# Patient Record
Sex: Female | Born: 1995 | Race: White | Hispanic: No | Marital: Married | Smoking: Former smoker
Health system: Southern US, Community
[De-identification: ages and names within clinical notes are randomized; demographics above are authoritative.]

## PROBLEM LIST (undated history)

## (undated) HISTORY — PX: OTHER SURGICAL HISTORY: SHX169

---
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
## 2016-04-04 ENCOUNTER — Emergency Department (HOSPITAL_COMMUNITY)
Admission: EM | Admit: 2016-04-04 | Discharge: 2016-04-04 | Disposition: A | Discharge status: Home / Self Care | Payer: Medicaid Other | Attending: Emergency Medicine | Admitting: Emergency Medicine

## 2016-04-04 ENCOUNTER — Encounter (HOSPITAL_COMMUNITY): Payer: Self-pay | Admitting: Emergency Medicine

## 2016-04-04 DIAGNOSIS — F17210 Nicotine dependence, cigarettes, uncomplicated: Secondary | ICD-10-CM | POA: Diagnosis not present

## 2016-04-04 DIAGNOSIS — Z79899 Other long term (current) drug therapy: Secondary | ICD-10-CM | POA: Insufficient documentation

## 2016-04-04 DIAGNOSIS — M436 Torticollis: Secondary | ICD-10-CM | POA: Insufficient documentation

## 2016-04-04 DIAGNOSIS — M542 Cervicalgia: Secondary | ICD-10-CM | POA: Diagnosis present

## 2016-04-04 DIAGNOSIS — Z791 Long term (current) use of non-steroidal anti-inflammatories (NSAID): Secondary | ICD-10-CM | POA: Diagnosis not present

## 2016-04-04 MED ORDER — IBUPROFEN 600 MG PO TABS: 600.0000 mg | ORAL_TABLET | Freq: Four times a day (QID) | ORAL | 0 refills | Status: DC | PRN

## 2016-04-04 MED ORDER — CYCLOBENZAPRINE HCL 5 MG PO TABS: 5.0000 mg | ORAL_TABLET | Freq: Three times a day (TID) | ORAL | 0 refills | Status: DC | PRN

## 2016-04-04 NOTE — ED Triage Notes (Signed)
Pt states she woke with left sided neck pain that radiates to shoulder. Pt denies any injury.

## 2016-04-04 NOTE — ED Provider Notes (Signed)
[facility]PT Provider Note   CSN: [id number] Arrival date & time: 04/04/16  1902  First Provider Contact:  None    By signing my name below, I, Majel HomerPeyton Lee, attest that this documentation has been prepared under the direction and in the presence of non-physician practitioner, Burgess AmorJulie Geanna Divirgilio, PA-C. Electronically Signed: Majel HomerPeyton Lee, Scribe. 04/04/2016. 7:37 PM.  History   Chief Complaint Chief Complaint  Patient presents with  . Neck Pain   The history is provided by the patient. No language interpreter was used.   HPI Comments: Kathleen Lucas is a 20 y.o. female who presents to the Emergency Department complaining of gradually worsening, "sharp," 6/10, central neck pain and neck stiffness that began 2 days ago and worsened today. Pt reports she noticed her pain when she awoke from a nap and expereinced a "pinching" sensation in her neck when turning her head to the side. She states she can move her neck more easily when turning her head to the right compared to her left. She notes her neck pain radiates between her shoulder blades. She states her pain is exacerbated with movement, lying down flat on her back, sitting straight up and moving her upper extremities upwards. She states she has taken ibuprofen, tylenol and aleve with no relief. She denies any injury or illness, numbness and weakness in her extremities, fever, coughing and shortness of breath. Pt reports she currently works in [employer] and is remodeling a house; she notes she was sent home from work today due to pain. She states no known drug allergies.   History reviewed. No pertinent past medical history.  Patient Active Problem List   Diagnosis Date Noted  . Dysmenorrhea 08/05/2014  . Intermittent constipation 08/05/2014   Past Surgical History:  Procedure Laterality Date  . toe nail     ingrown toenails   OB History    Gravida Para Term Preterm AB Living   0 0 0 0 0 0   SAB TAB Ectopic Multiple Live Births   0 0  0 0       Home Medications    Prior to Admission medications   Medication Sig Start Date End Date Taking? Authorizing Provider  clotrimazole (GYNE-LOTRIMIN) 1 % vaginal cream Place 1 Applicatorful vaginally 2 (two) times daily.    Historical Provider, MD  cyclobenzaprine (FLEXERIL) 5 MG tablet Take 1 tablet (5 mg total) by mouth 3 (three) times daily as needed for muscle spasms. 04/04/16   Burgess AmorJulie Palmer Shorey, PA-C  ibuprofen (ADVIL,MOTRIN) 600 MG tablet Take 1 tablet (600 mg total) by mouth every 6 (six) hours as needed. 04/04/16   Burgess AmorJulie Courtnie Brenes, PA-C  naproxen (NAPROSYN) 500 MG tablet Take 1 tablet (500 mg total) by mouth 2 (two) times daily. 12/30/13   Rolland PorterMark James, MD  norgestimate-ethinyl estradiol (ORTHO-CYCLEN,SPRINTEC,PREVIFEM) 0.25-35 MG-MCG tablet Take 1 tablet by mouth daily. 08/05/14   Jacklyn ShellFrances Cresenzo-Dishmon, CNM   Family History Family History  Problem Relation Age of Onset  . Xeroderma pigmentosa Mother   . Hyperthyroidism Mother   . Hypertension Father   . Heart disease Maternal Grandmother   . Diabetes Maternal Grandmother   . Cancer Maternal Grandmother     cervical, ovarian, colon   Social History Social History  Substance Use Topics  . Smoking status: Current Every Day Smoker    Packs/day: 0.50    Types: Cigarettes  . Smokeless tobacco: Never Used  . Alcohol use No   Allergies   Review of patient's allergies indicates no known  allergies.  Review of Systems Review of Systems  Constitutional: Negative for chills and fever.  Respiratory: Negative for cough and shortness of breath.   Musculoskeletal: Positive for neck pain and neck stiffness.  Neurological: Negative for dizziness, weakness, numbness and headaches.   Physical Exam Updated Vital Signs BP 119/70   Pulse 97   Temp 98.2 F (36.8 C)   Resp 20   Ht  (1.575 m)   Wt 135 lb (61.2 kg)   LMP 03/21/2016   SpO2 100%   BMI 24.69 kg/m   Physical Exam  Constitutional: She is oriented to person, place, and  time. She appears well-developed and well-nourished.  HENT:  Head: Normocephalic and atraumatic.  Eyes: Conjunctivae are normal. Pupils are equal, round, and reactive to light. Right eye exhibits no discharge. Left eye exhibits no discharge. No scleral icterus.  Neck: No JVD present. No tracheal deviation present.  TTP along left lateral neck to her left mid scapula; appreciable muscle spasm; no midline TTP of her C spine or thoracic spine; decreased range of motion most pronounced with leftward head rotation.  Pulmonary/Chest: Effort normal. No stridor.  Neurological: She is alert and oriented to person, place, and time. Coordination normal.  Equal grip strength; 2+ radial DTRs; normal sensation in BUE.   Psychiatric: She has a normal mood and affect. Her behavior is normal. Judgment and thought content normal.  Nursing note and vitals reviewed.   ED Treatments / Results  Labs (all labs ordered are listed, but only abnormal results are displayed) Labs Reviewed - No data to display  EKG  EKG Interpretation None      Radiology No results found.  Procedures Procedures  DIAGNOSTIC STUDIES:  Oxygen Saturation is 100% on RA, normal by my interpretation.    COORDINATION OF CARE:  7:27 PM Discussed treatment plan with pt at bedside and pt agreed to plan.  Medications Ordered in ED Medications - No data to display  Initial Impression / Assessment and Plan / ED Course  I have reviewed the triage vital signs and the nursing notes.  Pertinent labs & imaging results that were available during my care of the patient were reviewed by me and considered in my medical decision making (see chart for details).  Clinical Course    Pt with acute torticollis. She was prescribed flexeril, discussed heat tx followed by ROM exercises which were demonstrated.  Advised f/u with pcp for a recheck in one week if not improving.     I personally performed the services described in this  documentation, which was scribed in my presence. The recorded information has been reviewed and is accurate.   Final Clinical Impressions(s) / ED Diagnoses   Final diagnoses:  Torticollis, acute    New Prescriptions Discharge Medication List as of 04/04/2016  7:37 PM    START taking these medications   Details  cyclobenzaprine (FLEXERIL) 5 MG tablet Take 1 tablet (5 mg total) by mouth 3 (three) times daily as needed for muscle spasms., Starting Mon 04/04/2016, Print         Burgess Amor, PA-C 04/05/16 1825    Zadie Rhine, MD 04/06/16 [phone]

## 2016-05-12 ENCOUNTER — Emergency Department (HOSPITAL_COMMUNITY): Payer: Medicaid Other

## 2016-05-12 ENCOUNTER — Encounter (HOSPITAL_COMMUNITY): Payer: Self-pay

## 2016-05-12 ENCOUNTER — Emergency Department (HOSPITAL_COMMUNITY)
Admission: EM | Admit: 2016-05-12 | Discharge: 2016-05-12 | Disposition: A | Discharge status: Home / Self Care | Payer: Medicaid Other | Attending: Emergency Medicine | Admitting: Emergency Medicine

## 2016-05-12 DIAGNOSIS — R05 Cough: Secondary | ICD-10-CM | POA: Diagnosis present

## 2016-05-12 DIAGNOSIS — J209 Acute bronchitis, unspecified: Secondary | ICD-10-CM | POA: Insufficient documentation

## 2016-05-12 DIAGNOSIS — F17210 Nicotine dependence, cigarettes, uncomplicated: Secondary | ICD-10-CM | POA: Insufficient documentation

## 2016-05-12 DIAGNOSIS — Z79899 Other long term (current) drug therapy: Secondary | ICD-10-CM | POA: Insufficient documentation

## 2016-05-12 LAB — POC URINE PREG, ED: Preg Test, Ur: NEGATIVE

## 2016-05-12 MED ORDER — ALBUTEROL SULFATE HFA 108 (90 BASE) MCG/ACT IN AERS: 1.0000 | INHALATION_SPRAY | Freq: Four times a day (QID) | RESPIRATORY_TRACT | 0 refills | Status: AC | PRN

## 2016-05-12 MED ORDER — AZITHROMYCIN 250 MG PO TABS: 250.0000 mg | ORAL_TABLET | Freq: Every day | ORAL | 0 refills | Status: DC

## 2016-05-12 NOTE — ED Provider Notes (Signed)
[facility]-EMERGENCY DEPT Provider Note   CSN: [id number] Arrival date & time: 05/12/16  2111     History   Chief Complaint Chief Complaint  Patient presents with  . Cough    HPI Kathleen Lucas is a 20 y.o. female.  The history is provided by the patient. No language interpreter was used.  Cough  This is a new problem. The problem occurs constantly. The problem has been gradually worsening. The cough is productive of sputum. There has been no fever. Pertinent negatives include no wheezing. She has tried nothing for the symptoms. The treatment provided no relief. She is a smoker. Her past medical history does not include bronchitis or pneumonia.   Pt reports Mother recently diagnosed with bronchitis.  Pt became sick after seeing Mother, History reviewed. No pertinent past medical history.  Patient Active Problem List   Diagnosis Date Noted  . Dysmenorrhea 08/05/2014  . Intermittent constipation 08/05/2014    Past Surgical History:  Procedure Laterality Date  . toe nail     ingrown toenails    OB History    Gravida Para Term Preterm AB Living   0 0 0 0 0 0   SAB TAB Ectopic Multiple Live Births   0 0 0 0         Home Medications    Prior to Admission medications   Medication Sig Start Date End Date Taking? Authorizing Provider  albuterol (PROVENTIL HFA;VENTOLIN HFA) 108 (90 Base) MCG/ACT inhaler Inhale 1-2 puffs into the lungs every 6 (six) hours as needed for wheezing or shortness of breath. 05/12/16   Elson Areas, PA-C  azithromycin (ZITHROMAX) 250 MG tablet Take 1 tablet (250 mg total) by mouth daily. Take first 2 tablets together, then 1 every day until finished. 05/12/16   Elson Areas, PA-C  clotrimazole (GYNE-LOTRIMIN) 1 % vaginal cream Place 1 Applicatorful vaginally 2 (two) times daily.    Historical Provider, MD  cyclobenzaprine (FLEXERIL) 5 MG tablet Take 1 tablet (5 mg total) by mouth 3 (three) times daily as needed for muscle spasms. 04/04/16   Burgess Amor,  PA-C  ibuprofen (ADVIL,MOTRIN) 600 MG tablet Take 1 tablet (600 mg total) by mouth every 6 (six) hours as needed. 04/04/16   Burgess Amor, PA-C  naproxen (NAPROSYN) 500 MG tablet Take 1 tablet (500 mg total) by mouth 2 (two) times daily. 12/30/13   Rolland Porter, MD  norgestimate-ethinyl estradiol (ORTHO-CYCLEN,SPRINTEC,PREVIFEM) 0.25-35 MG-MCG tablet Take 1 tablet by mouth daily. 08/05/14   Jacklyn Shell, CNM    Family History Family History  Problem Relation Age of Onset  . Xeroderma pigmentosa Mother   . Hyperthyroidism Mother   . Hypertension Father   . Heart disease Maternal Grandmother   . Diabetes Maternal Grandmother   . Cancer Maternal Grandmother     cervical, ovarian, colon    Social History Social History  Substance Use Topics  . Smoking status: Current Every Day Smoker    Packs/day: 0.50    Types: Cigarettes  . Smokeless tobacco: Never Used  . Alcohol use No     Allergies   Review of patient's allergies indicates no known allergies.   Review of Systems Review of Systems  Respiratory: Positive for cough. Negative for wheezing.   All other systems reviewed and are negative.    Physical Exam Updated Vital Signs BP 126/68 (BP Location: Left Arm)   Pulse 111   Temp 99.2 F (37.3 C) (Oral)   Resp 20   Ht 5'  2" (1.575 m)   Wt 65.8 kg   LMP 04/24/2016 (Approximate) Comment: Negative U-preg in ED 05/12/2016  SpO2 100%   BMI 26.52 kg/m   Physical Exam  Constitutional: She appears well-developed and well-nourished. No distress.  HENT:  Head: Normocephalic and atraumatic.  Eyes: Conjunctivae are normal.  Neck: Neck supple.  Cardiovascular: Normal rate and regular rhythm.   No murmur heard. Pulmonary/Chest: Effort normal and breath sounds normal. No respiratory distress. She exhibits no tenderness.  Abdominal: Soft. There is no tenderness.  Musculoskeletal: She exhibits no edema.  Neurological: She is alert.  Skin: Skin is warm and dry.  Psychiatric:  She has a normal mood and affect.  Nursing note and vitals reviewed.    ED Treatments / Results  Labs (all labs ordered are listed, but only abnormal results are displayed) Labs Reviewed  POC URINE PREG, ED    EKG  EKG Interpretation None       Radiology Dg Chest 2 View  Result Date: 05/12/2016 CLINICAL DATA:  Cough and chest pain for 3 days. EXAM: CHEST  2 VIEW COMPARISON:  08/02/2013 FINDINGS: The cardiomediastinal contours are normal. There is central bronchial thickening. Pulmonary vasculature is normal. No consolidation, pleural effusion, or pneumothorax. No acute osseous abnormalities are seen. IMPRESSION: Central bronchial thickening, may be bronchitis, acute or chronic versus asthma. Electronically Signed   By: Rubye OaksMelanie  Ehinger M.D.   On: 05/12/2016 23:07    Procedures Procedures (including critical care time)  Medications Ordered in ED Medications - No data to display   Initial Impression / Assessment and Plan / ED Course  I have reviewed the triage vital signs and the nursing notes.  Pertinent labs & imaging results that were available during my care of the patient were reviewed by me and considered in my medical decision making (see chart for details).  Clinical Course    Pt counseled to stop smoking.   Follow up with primary MD for recheck in 1 week  Final Clinical Impressions(s) / ED Diagnoses   Final diagnoses:  Acute bronchitis, unspecified organism    New Prescriptions Discharge Medication List as of 05/12/2016 11:12 PM    START taking these medications   Details  albuterol (PROVENTIL HFA;VENTOLIN HFA) 108 (90 Base) MCG/ACT inhaler Inhale 1-2 puffs into the lungs every 6 (six) hours as needed for wheezing or shortness of breath., Starting Thu 05/12/2016, Print    azithromycin (ZITHROMAX) 250 MG tablet Take 1 tablet (250 mg total) by mouth daily. Take first 2 tablets together, then 1 every day until finished., Starting Thu 05/12/2016, Print        An After Visit Summary was printed and given to the patient.   Elson AreasLeslie K Sofia, PA-C 05/12/16 2350    Lonia SkinnerLeslie K WestportSofia, PA-C 05/12/16 Arletha [id number]    Raeford RazorStephen Kohut, MD 05/15/16 [phone]

## 2016-05-12 NOTE — ED Triage Notes (Signed)
Left ear hurts, having a runny nose, and throat hurts some.

## 2016-05-12 NOTE — ED Triage Notes (Signed)
Patient states that she has been coughing and having cold symptoms for several days.  Today is the third day.  When I start coughing it feels like my heart races and I get dizzy sometimes.

## 2016-10-15 IMAGING — DX DG CHEST 2V
2 series · 2 of 2 positions shown · non-contrast
Comparison: 08/02/2013

CLINICAL DATA: Cough and chest pain for 3 days.

EXAM:
CHEST  2 VIEW

[chest pa]
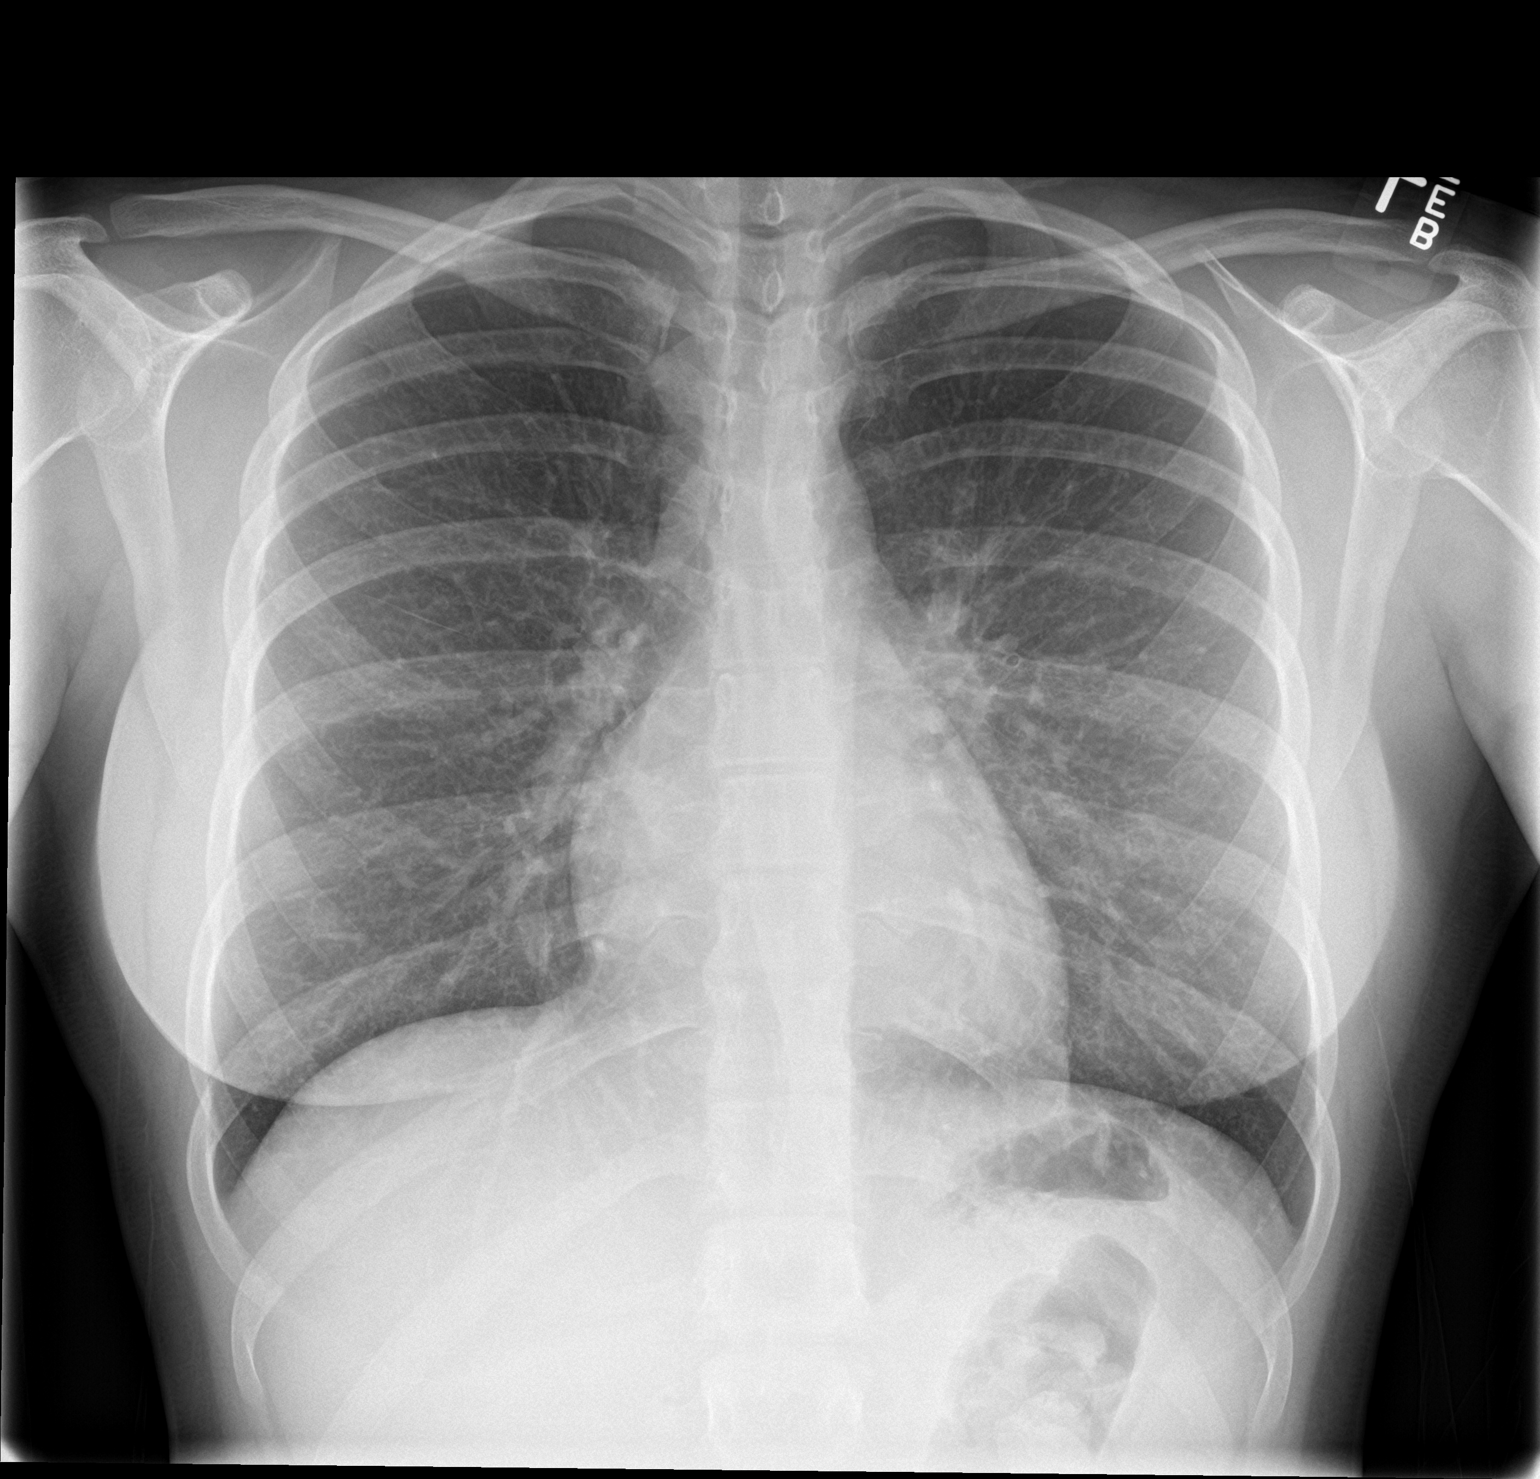

[chest lat]
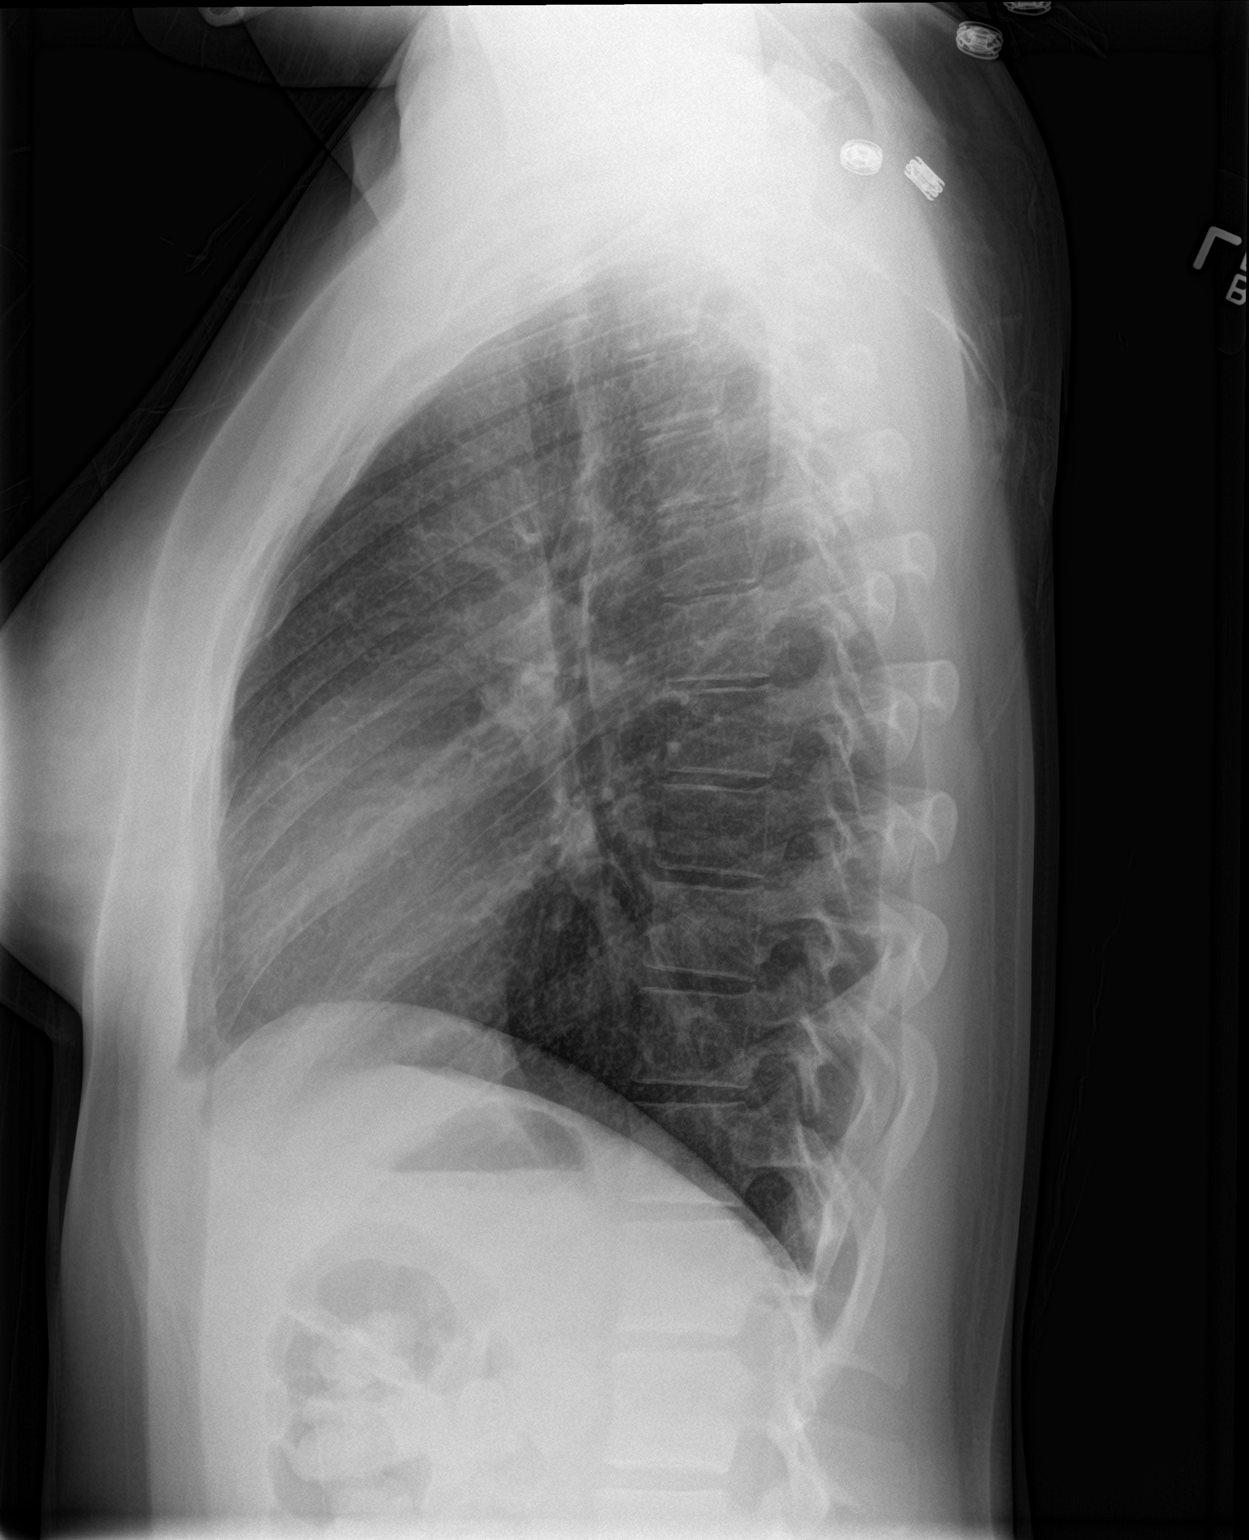

[2 of 2 positions shown; findings below may reference images not displayed]

FINDINGS: The cardiomediastinal contours are normal. There is central
bronchial thickening. Pulmonary vasculature is normal. No
consolidation, pleural effusion, or pneumothorax. No acute osseous
abnormalities are seen.
IMPRESSION: Central bronchial thickening, may be bronchitis, acute or chronic
versus asthma.

## 2019-06-01 ENCOUNTER — Ambulatory Visit (HOSPITAL_COMMUNITY)
Admission: EM | Admit: 2019-06-01 | Discharge: 2019-06-02 | Disposition: A | Discharge status: Home / Self Care | Payer: Self-pay | Attending: General Surgery | Admitting: General Surgery

## 2019-06-01 ENCOUNTER — Other Ambulatory Visit: Payer: Self-pay

## 2019-06-01 DIAGNOSIS — Z791 Long term (current) use of non-steroidal anti-inflammatories (NSAID): Secondary | ICD-10-CM | POA: Insufficient documentation

## 2019-06-01 DIAGNOSIS — Z20828 Contact with and (suspected) exposure to other viral communicable diseases: Secondary | ICD-10-CM | POA: Insufficient documentation

## 2019-06-01 DIAGNOSIS — Z833 Family history of diabetes mellitus: Secondary | ICD-10-CM | POA: Insufficient documentation

## 2019-06-01 DIAGNOSIS — Z79899 Other long term (current) drug therapy: Secondary | ICD-10-CM | POA: Insufficient documentation

## 2019-06-01 DIAGNOSIS — Z8249 Family history of ischemic heart disease and other diseases of the circulatory system: Secondary | ICD-10-CM | POA: Insufficient documentation

## 2019-06-01 DIAGNOSIS — Z800 Family history of malignant neoplasm of digestive organs: Secondary | ICD-10-CM | POA: Insufficient documentation

## 2019-06-01 DIAGNOSIS — Z8349 Family history of other endocrine, nutritional and metabolic diseases: Secondary | ICD-10-CM | POA: Insufficient documentation

## 2019-06-01 DIAGNOSIS — K3580 Unspecified acute appendicitis: Secondary | ICD-10-CM | POA: Insufficient documentation

## 2019-06-01 DIAGNOSIS — N946 Dysmenorrhea, unspecified: Secondary | ICD-10-CM | POA: Insufficient documentation

## 2019-06-01 DIAGNOSIS — K353 Acute appendicitis with localized peritonitis, without perforation or gangrene: Secondary | ICD-10-CM

## 2019-06-01 DIAGNOSIS — F17210 Nicotine dependence, cigarettes, uncomplicated: Secondary | ICD-10-CM | POA: Insufficient documentation

## 2019-06-01 DIAGNOSIS — Z8041 Family history of malignant neoplasm of ovary: Secondary | ICD-10-CM | POA: Insufficient documentation

## 2019-06-01 DIAGNOSIS — N83201 Unspecified ovarian cyst, right side: Secondary | ICD-10-CM | POA: Insufficient documentation

## 2019-06-01 DIAGNOSIS — Z8049 Family history of malignant neoplasm of other genital organs: Secondary | ICD-10-CM | POA: Insufficient documentation

## 2019-06-01 LAB — CBC WITH DIFFERENTIAL/PLATELET
Abs Immature Granulocytes: 0.03 10*3/uL (ref 0.00–0.07)
Basophils Absolute: 0.1 10*3/uL (ref 0.0–0.1)
Basophils Relative: 1 %
Eosinophils Absolute: 0.7 10*3/uL — ABNORMAL HIGH (ref 0.0–0.5)
Eosinophils Relative: 6 %
HCT: 42.1 % (ref 36.0–46.0)
Hemoglobin: 13.6 g/dL (ref 12.0–15.0)
Immature Granulocytes: 0 %
Lymphocytes Relative: 30 %
Lymphs Abs: 3.3 10*3/uL (ref 0.7–4.0)
MCH: 29.4 pg (ref 26.0–34.0)
MCHC: 32.3 g/dL (ref 30.0–36.0)
MCV: 91.1 fL (ref 80.0–100.0)
Monocytes Absolute: 0.9 10*3/uL (ref 0.1–1.0)
Monocytes Relative: 8 %
Neutro Abs: 6.1 10*3/uL (ref 1.7–7.7)
Neutrophils Relative %: 55 %
Platelets: 298 10*3/uL (ref 150–400)
RBC: 4.62 MIL/uL (ref 3.87–5.11)
RDW: 13.3 % (ref 11.5–15.5)
WBC: 11 10*3/uL — ABNORMAL HIGH (ref 4.0–10.5)
nRBC: 0 % (ref 0.0–0.2)

## 2019-06-01 LAB — URINALYSIS, ROUTINE W REFLEX MICROSCOPIC
Bilirubin Urine: NEGATIVE
Glucose, UA: NEGATIVE mg/dL
Hgb urine dipstick: NEGATIVE
Ketones, ur: NEGATIVE mg/dL
Leukocytes,Ua: NEGATIVE
Nitrite: NEGATIVE
Protein, ur: NEGATIVE mg/dL
Specific Gravity, Urine: 1.005 (ref 1.005–1.030)
pH: 6 (ref 5.0–8.0)

## 2019-06-01 LAB — COMPREHENSIVE METABOLIC PANEL
ALT: 24 U/L (ref 0–44)
AST: 23 U/L (ref 15–41)
Albumin: 4.6 g/dL (ref 3.5–5.0)
Alkaline Phosphatase: 76 U/L (ref 38–126)
Anion gap: 8 (ref 5–15)
BUN: 9 mg/dL (ref 6–20)
CO2: 22 mmol/L (ref 22–32)
Calcium: 8.8 mg/dL — ABNORMAL LOW (ref 8.9–10.3)
Chloride: 105 mmol/L (ref 98–111)
Creatinine, Ser: 0.67 mg/dL (ref 0.44–1.00)
GFR calc Af Amer: >60 mL/min (ref >60)
GFR calc non Af Amer: >60 mL/min (ref >60)
Glucose, Bld: 93 mg/dL (ref 70–99)
Potassium: 3.7 mmol/L (ref 3.5–5.1)
Sodium: 135 mmol/L (ref 135–145)
Total Bilirubin: 0.3 mg/dL (ref 0.3–1.2)
Total Protein: 7.8 g/dL (ref 6.5–8.1)

## 2019-06-01 LAB — LIPASE, BLOOD: Lipase: 28 U/L (ref 11–51)

## 2019-06-01 MED ORDER — SODIUM CHLORIDE 0.9 % IV BOLUS
500.0000 mL | Freq: Once | INTRAVENOUS | Status: AC
  Administered 2019-06-01: 500 mL via INTRAVENOUS

## 2019-06-01 MED ORDER — ONDANSETRON HCL 4 MG/2ML IJ SOLN
4.0000 mg | Freq: Once | INTRAMUSCULAR | Status: AC
  Administered 2019-06-01: 4 mg via INTRAVENOUS
  Filled 2019-06-01: qty 2

## 2019-06-01 MED ORDER — MORPHINE SULFATE (PF) 4 MG/ML IV SOLN
4.0000 mg | Freq: Once | INTRAVENOUS | Status: AC
  Administered 2019-06-01: 4 mg via INTRAVENOUS
  Filled 2019-06-01: qty 1

## 2019-06-01 NOTE — ED Triage Notes (Signed)
Patient states that that she is having right sided abdominal pain that radiates to her flank area right side that started about 1 hour ago. Patient states nausea.

## 2019-06-02 ENCOUNTER — Emergency Department (HOSPITAL_COMMUNITY): Payer: Self-pay | Admitting: Anesthesiology

## 2019-06-02 ENCOUNTER — Emergency Department (HOSPITAL_COMMUNITY): Payer: Self-pay

## 2019-06-02 ENCOUNTER — Encounter (HOSPITAL_COMMUNITY)
Admission: EM | Disposition: A | Discharge status: Home / Self Care | Payer: Self-pay | Source: Home / Self Care | Attending: Emergency Medicine

## 2019-06-02 DIAGNOSIS — K353 Acute appendicitis with localized peritonitis, without perforation or gangrene: Secondary | ICD-10-CM

## 2019-06-02 HISTORY — PX: LAPAROSCOPIC APPENDECTOMY: SHX408

## 2019-06-02 LAB — PREGNANCY, URINE: Preg Test, Ur: NEGATIVE

## 2019-06-02 LAB — SARS CORONAVIRUS 2 BY RT PCR (HOSPITAL ORDER, PERFORMED IN ~~LOC~~ HOSPITAL LAB): SARS Coronavirus 2: NEGATIVE

## 2019-06-02 SURGERY — APPENDECTOMY, LAPAROSCOPIC
Anesthesia: General

## 2019-06-02 MED ORDER — DEXAMETHASONE SODIUM PHOSPHATE 10 MG/ML IJ SOLN
INTRAMUSCULAR | Status: DC | PRN
  Administered 2019-06-02: 5 mg via INTRAVENOUS

## 2019-06-02 MED ORDER — ONDANSETRON HCL 4 MG/2ML IJ SOLN
INTRAMUSCULAR | Status: AC
  Filled 2019-06-02: qty 2

## 2019-06-02 MED ORDER — ROCURONIUM BROMIDE 10 MG/ML (PF) SYRINGE
PREFILLED_SYRINGE | INTRAVENOUS | Status: AC
  Filled 2019-06-02: qty 10

## 2019-06-02 MED ORDER — KETOROLAC TROMETHAMINE 30 MG/ML IJ SOLN
30.0000 mg | Freq: Once | INTRAMUSCULAR | Status: AC
  Administered 2019-06-02: 30 mg via INTRAVENOUS
  Filled 2019-06-02: qty 1

## 2019-06-02 MED ORDER — CHLORHEXIDINE GLUCONATE CLOTH 2 % EX PADS: 6.0000 | MEDICATED_PAD | Freq: Once | CUTANEOUS | Status: DC

## 2019-06-02 MED ORDER — BUPIVACAINE LIPOSOME 1.3 % IJ SUSP
INTRAMUSCULAR | Status: AC
  Filled 2019-06-02: qty 20

## 2019-06-02 MED ORDER — LACTATED RINGERS IV SOLN
INTRAVENOUS | Status: DC
  Administered 2019-06-02 (×2): via INTRAVENOUS

## 2019-06-02 MED ORDER — SUGAMMADEX SODIUM 500 MG/5ML IV SOLN
INTRAVENOUS | Status: DC | PRN
  Administered 2019-06-02: 167.2 mg via INTRAVENOUS

## 2019-06-02 MED ORDER — FENTANYL CITRATE (PF) 250 MCG/5ML IJ SOLN
INTRAMUSCULAR | Status: AC
  Filled 2019-06-02: qty 5

## 2019-06-02 MED ORDER — PROPOFOL 10 MG/ML IV BOLUS
INTRAVENOUS | Status: AC
  Filled 2019-06-02: qty 20

## 2019-06-02 MED ORDER — LACTATED RINGERS IV SOLN: INTRAVENOUS | Status: DC

## 2019-06-02 MED ORDER — FENTANYL CITRATE (PF) 100 MCG/2ML IJ SOLN
INTRAMUSCULAR | Status: DC | PRN
  Administered 2019-06-02 (×3): 50 ug via INTRAVENOUS
  Administered 2019-06-02: 100 ug via INTRAVENOUS

## 2019-06-02 MED ORDER — HYDROMORPHONE HCL 1 MG/ML IJ SOLN
0.2500 mg | INTRAMUSCULAR | Status: DC | PRN
  Administered 2019-06-02 (×2): 0.25 mg via INTRAVENOUS

## 2019-06-02 MED ORDER — SUCCINYLCHOLINE CHLORIDE 20 MG/ML IJ SOLN
INTRAMUSCULAR | Status: DC | PRN
  Administered 2019-06-02: 100 mg via INTRAVENOUS
  Administered 2019-06-02: 25 mg via INTRAVENOUS

## 2019-06-02 MED ORDER — HYDROCODONE-ACETAMINOPHEN 7.5-325 MG PO TABS: 1.0000 | ORAL_TABLET | Freq: Once | ORAL | Status: DC | PRN

## 2019-06-02 MED ORDER — HYDROCODONE-ACETAMINOPHEN 5-325 MG PO TABS: 1.0000 | ORAL_TABLET | ORAL | 0 refills | Status: DC | PRN

## 2019-06-02 MED ORDER — HYDROMORPHONE HCL 1 MG/ML IJ SOLN
INTRAMUSCULAR | Status: AC
  Filled 2019-06-02: qty 0.5

## 2019-06-02 MED ORDER — DEXAMETHASONE SODIUM PHOSPHATE 10 MG/ML IJ SOLN
INTRAMUSCULAR | Status: AC
  Filled 2019-06-02: qty 1

## 2019-06-02 MED ORDER — PROPOFOL 10 MG/ML IV BOLUS
INTRAVENOUS | Status: DC | PRN
  Administered 2019-06-02: 180 mg via INTRAVENOUS

## 2019-06-02 MED ORDER — FENTANYL CITRATE (PF) 100 MCG/2ML IJ SOLN
INTRAMUSCULAR | Status: AC
  Filled 2019-06-02: qty 2

## 2019-06-02 MED ORDER — MIDAZOLAM HCL 2 MG/2ML IJ SOLN: 0.5000 mg | Freq: Once | INTRAMUSCULAR | Status: DC | PRN

## 2019-06-02 MED ORDER — HEMOSTATIC AGENTS (NO CHARGE) OPTIME
TOPICAL | Status: DC | PRN
  Administered 2019-06-02: 1 via TOPICAL

## 2019-06-02 MED ORDER — MORPHINE SULFATE (PF) 4 MG/ML IV SOLN: 4.0000 mg | INTRAVENOUS | Status: DC | PRN

## 2019-06-02 MED ORDER — SUCCINYLCHOLINE CHLORIDE 200 MG/10ML IV SOSY
PREFILLED_SYRINGE | INTRAVENOUS | Status: AC
  Filled 2019-06-02: qty 10

## 2019-06-02 MED ORDER — SODIUM CHLORIDE 0.9 % IR SOLN
Status: DC | PRN
  Administered 2019-06-02: 1000 mL

## 2019-06-02 MED ORDER — METRONIDAZOLE IN NACL 5-0.79 MG/ML-% IV SOLN
500.0000 mg | Freq: Once | INTRAVENOUS | Status: AC
  Administered 2019-06-02: 500 mg via INTRAVENOUS
  Filled 2019-06-02: qty 100

## 2019-06-02 MED ORDER — ONDANSETRON HCL 4 MG/2ML IJ SOLN: 4.0000 mg | Freq: Four times a day (QID) | INTRAMUSCULAR | Status: DC | PRN

## 2019-06-02 MED ORDER — PROMETHAZINE HCL 25 MG/ML IJ SOLN: 6.2500 mg | INTRAMUSCULAR | Status: DC | PRN

## 2019-06-02 MED ORDER — IOHEXOL 300 MG/ML  SOLN
100.0000 mL | Freq: Once | INTRAMUSCULAR | Status: AC | PRN
  Administered 2019-06-02: 100 mL via INTRAVENOUS

## 2019-06-02 MED ORDER — BUPIVACAINE LIPOSOME 1.3 % IJ SUSP
INTRAMUSCULAR | Status: DC | PRN
  Administered 2019-06-02: 20 mL

## 2019-06-02 MED ORDER — ONDANSETRON HCL 4 MG/2ML IJ SOLN
INTRAMUSCULAR | Status: DC | PRN
  Administered 2019-06-02: 4 mg via INTRAVENOUS

## 2019-06-02 MED ORDER — SODIUM CHLORIDE 0.9 % IV SOLN
2.0000 g | Freq: Once | INTRAVENOUS | Status: AC
  Administered 2019-06-02: 04:00:00 2 g via INTRAVENOUS
  Filled 2019-06-02: qty 20

## 2019-06-02 SURGICAL SUPPLY — 65 items
ADH SKN CLS APL DERMABOND .7 (GAUZE/BANDAGES/DRESSINGS) ×1
APL PRP STRL LF DISP 70% ISPRP (MISCELLANEOUS) ×1
APL SRG 38 LTWT LNG FL B (MISCELLANEOUS) ×1
APPLICATOR ARISTA FLEXITIP XL (MISCELLANEOUS) ×2 IMPLANT
BAG RETRIEVAL 10 (BASKET) ×1
BAG RETRIEVAL 10MM (BASKET) ×1
CHLORAPREP W/TINT 26 (MISCELLANEOUS) ×3 IMPLANT
CLOTH BEACON ORANGE TIMEOUT ST (SAFETY) ×3 IMPLANT
COVER LIGHT HANDLE STERIS (MISCELLANEOUS) ×6 IMPLANT
COVER WAND RF STERILE (DRAPES) ×3 IMPLANT
CUTTER FLEX LINEAR 45M (STAPLE) ×3 IMPLANT
DECANTER SPIKE VIAL GLASS SM (MISCELLANEOUS) ×3 IMPLANT
DERMABOND ADVANCED (GAUZE/BANDAGES/DRESSINGS) ×2
DERMABOND ADVANCED .7 DNX12 (GAUZE/BANDAGES/DRESSINGS) ×1 IMPLANT
ELECT REM PT RETURN 9FT ADLT (ELECTROSURGICAL) ×3
ELECTRODE REM PT RTRN 9FT ADLT (ELECTROSURGICAL) ×1 IMPLANT
EVACUATOR SMOKE 8.L (FILTER) ×3 IMPLANT
GLOVE BIO SURGEON STRL SZ 6.5 (GLOVE) ×1 IMPLANT
GLOVE BIO SURGEONS STRL SZ 6.5 (GLOVE) ×1
GLOVE BIOGEL PI IND STRL 6.5 (GLOVE) IMPLANT
GLOVE BIOGEL PI IND STRL 7.0 (GLOVE) ×3 IMPLANT
GLOVE BIOGEL PI IND STRL 7.5 (GLOVE) IMPLANT
GLOVE BIOGEL PI INDICATOR 6.5 (GLOVE) ×2
GLOVE BIOGEL PI INDICATOR 7.0 (GLOVE) ×6
GLOVE BIOGEL PI INDICATOR 7.5 (GLOVE) ×2
GLOVE SURG SS PI 7.5 STRL IVOR (GLOVE) ×3 IMPLANT
GOWN STRL REUS W/ TWL XL LVL3 (GOWN DISPOSABLE) ×1 IMPLANT
GOWN STRL REUS W/TWL LRG LVL3 (GOWN DISPOSABLE) ×3 IMPLANT
GOWN STRL REUS W/TWL XL LVL3 (GOWN DISPOSABLE) ×3
HEMOSTAT ARISTA ABSORB 1G (HEMOSTASIS) ×2 IMPLANT
INST SET LAPROSCOPIC AP (KITS) ×3 IMPLANT
IV NS IRRIG 3000ML ARTHROMATIC (IV SOLUTION) IMPLANT
KIT TURNOVER KIT A (KITS) ×3 IMPLANT
MANIFOLD NEPTUNE II (INSTRUMENTS) ×3 IMPLANT
NDL HYPO 18GX1.5 BLUNT FILL (NEEDLE) ×1 IMPLANT
NDL INSUFFLATION 14GA 120MM (NEEDLE) ×1 IMPLANT
NEEDLE HYPO 18GX1.5 BLUNT FILL (NEEDLE) ×3 IMPLANT
NEEDLE HYPO 22GX1.5 SAFETY (NEEDLE) ×3 IMPLANT
NEEDLE INSUFFLATION 14GA 120MM (NEEDLE) ×3 IMPLANT
NS IRRIG 1000ML POUR BTL (IV SOLUTION) ×3 IMPLANT
PACK LAP CHOLE LZT030E (CUSTOM PROCEDURE TRAY) ×3 IMPLANT
PAD ARMBOARD 7.5X6 YLW CONV (MISCELLANEOUS) ×3 IMPLANT
PENCIL HANDSWITCHING (ELECTRODE) ×3 IMPLANT
RELOAD 45 VASCULAR/THIN (ENDOMECHANICALS) ×3 IMPLANT
RELOAD STAPLE 45 2.5 WHT GRN (ENDOMECHANICALS) IMPLANT
RELOAD STAPLE 45 3.5 BLU ETS (ENDOMECHANICALS) IMPLANT
RELOAD STAPLE TA45 3.5 REG BLU (ENDOMECHANICALS) IMPLANT
SET BASIN LINEN APH (SET/KITS/TRAYS/PACK) ×3 IMPLANT
SET TUBE IRRIG SUCTION NO TIP (IRRIGATION / IRRIGATOR) IMPLANT
SET TUBE SMOKE EVAC HIGH FLOW (TUBING) ×3 IMPLANT
SHEARS HARMONIC ACE PLUS 36CM (ENDOMECHANICALS) ×3 IMPLANT
SUT MNCRL AB 4-0 PS2 18 (SUTURE) ×6 IMPLANT
SUT VICRYL 0 UR6 27IN ABS (SUTURE) ×3 IMPLANT
SYR 20ML LL LF (SYRINGE) ×6 IMPLANT
SYS BAG RETRIEVAL 10MM (BASKET) ×1
SYSTEM BAG RETRIEVAL 10MM (BASKET) ×1 IMPLANT
TRAY FOLEY W/BAG SLVR 16FR (SET/KITS/TRAYS/PACK) ×3
TRAY FOLEY W/BAG SLVR 16FR ST (SET/KITS/TRAYS/PACK) ×1 IMPLANT
TROCAR ENDO BLADELESS 11MM (ENDOMECHANICALS) ×3 IMPLANT
TROCAR ENDO BLADELESS 12MM (ENDOMECHANICALS) ×3 IMPLANT
TROCAR XCEL NON-BLD 5MMX100MML (ENDOMECHANICALS) ×3 IMPLANT
TUBE CONNECTING 12'X1/4 (SUCTIONS) ×1
TUBE CONNECTING 12X1/4 (SUCTIONS) ×2 IMPLANT
WARMER LAPAROSCOPE (MISCELLANEOUS) ×3 IMPLANT
YANKAUER SUCT 12FT TUBE ARGYLE (SUCTIONS) ×3 IMPLANT

## 2019-06-02 NOTE — Op Note (Signed)
Patient:  Kathleen Lucas  DOB:  [birth year]  MRN:  [id number]   Preop Diagnosis: Acute appendicitis  Postop Diagnosis: Same, right ovarian cyst  Procedure: Laparoscopic appendectomy  Surgeon: Aviva Signs, MD  Anes: General endotracheal  Indications: Patient is a 23 year old white female who presents with a 24-hour history of worsening right lower quadrant abdominal pain.  CT scan of the abdomen reveals early acute appendicitis with also a right ovarian cyst.  The risks and benefits of the procedure including bleeding, infection, and the possibility of an open procedure were fully explained to the patient, who gave informed consent.  Procedure note: The patient was placed in supine position.  After induction of general endotracheal anesthesia, the abdomen was prepped and draped using the usual sterile technique with ChloraPrep.  Surgical site confirmation was performed.  A supraumbilical incision was made down to the fascia.  A Veress needle was introduced into the abdominal cavity and confirmation of placement was done using the saline drop test.  The abdomen was then insufflated to 15 mmHg pressure.  An 11 mm trocar was introduced into the abdominal cavity under direct visualization without difficulty.  The patient was placed in deeper Trendelenburg position and an additional 12 mm trocar was placed in the suprapubic region and a 5 mm trocar was placed in left lower quadrant region.  The appendix was visualized and noted to be injected.  There was also some blood-tinged clear fluid in the right lower quadrant and pelvis.  It appeared to be a ruptured right ovarian cyst.  The mesoappendix was divided using the harmonic scalpel.  A vascular Endo GIA was placed across the base the appendix and fired.  The juncture of the appendix to the cecum was fully visualized.  The appendix was removed using an Endo Catch bag without difficulty.  The staple line was inspected and noted to be within normal  limits.  Arista was placed along the remaining mesentery.  All fluid and air were then evacuated from the abdominal cavity prior to removal of the trochars.  All wounds were irrigated with normal saline.  All wounds were injected with Exparel.  The supraumbilical fascia was reapproximated using an 0 Vicryl interrupted suture.  All skin incisions were closed using a 4-0 Monocryl subcuticular suture.  Dermabond was applied.  All tape and needle counts were correct at the end of the procedure.  The patient was extubated in the operating room and transferred to PACU in stable condition.  Complications: None  EBL: Minimal  Specimen: Appendix

## 2019-06-02 NOTE — Progress Notes (Signed)
Kathleen Lucas was seen and treated in our emergency department on 06/01/2019. She also required surgical care. She may return to work on 06/08/2019 .   Treatment Team:  Attending Provider: Aviva Signs, MD

## 2019-06-02 NOTE — ED Provider Notes (Signed)
[facility] EMERGENCY DEPARTMENT Provider Note   CSN: [id number] Arrival date & time: 06/01/19  2037     History   Chief Complaint Chief Complaint  Patient presents with   Abdominal Pain    right side    HPI Kathleen Lucas is a 23 y.o. female.     Patient presents to the emergency department for evaluation of right sided abdominal pain that started earlier today.  Pain has progressively worsened.  She has had mild nausea but no vomiting.  She has had diarrhea today.  She has not noticed any fever.  Patient denies urinary symptoms including dysuria and frequency.  She has not had any vaginal discharge or bleeding.     No past medical history on file.  Patient Active Problem List   Diagnosis Date Noted   Acute appendicitis with localized peritonitis, without perforation, abscess, or gangrene    Dysmenorrhea 08/05/2014   Intermittent constipation 08/05/2014    Past Surgical History:  Procedure Laterality Date   toe nail     ingrown toenails     OB History    Gravida  0   Para  0   Term  0   Preterm  0   AB  0   Living  0     SAB  0   TAB  0   Ectopic  0   Multiple  0   Live Births               Home Medications    Prior to Admission medications   Medication Sig Start Date End Date Taking? Authorizing Provider  albuterol (PROVENTIL HFA;VENTOLIN HFA) 108 (90 Base) MCG/ACT inhaler Inhale 1-2 puffs into the lungs every 6 (six) hours as needed for wheezing or shortness of breath. 05/12/16   Fransico Meadow, PA-C  azithromycin (ZITHROMAX) 250 MG tablet Take 1 tablet (250 mg total) by mouth daily. Take first 2 tablets together, then 1 every day until finished. 05/12/16   Fransico Meadow, PA-C  clotrimazole (GYNE-LOTRIMIN) 1 % vaginal cream Place 1 Applicatorful vaginally 2 (two) times daily.    [provider]  cyclobenzaprine (FLEXERIL) 5 MG tablet Take 1 tablet (5 mg total) by mouth 3 (three) times daily as needed for muscle spasms.  04/04/16   Evalee Jefferson, PA-C  HYDROcodone-acetaminophen (NORCO) 5-325 MG tablet Take 1 tablet by mouth every 4 (four) hours as needed for moderate pain. 06/02/19   Aviva Signs, MD  ibuprofen (ADVIL,MOTRIN) 600 MG tablet Take 1 tablet (600 mg total) by mouth every 6 (six) hours as needed. 04/04/16   Evalee Jefferson, PA-C  naproxen (NAPROSYN) 500 MG tablet Take 1 tablet (500 mg total) by mouth 2 (two) times daily. 12/30/13   Tanna Furry, MD  norgestimate-ethinyl estradiol (ORTHO-CYCLEN,SPRINTEC,PREVIFEM) 0.25-35 MG-MCG tablet Take 1 tablet by mouth daily. 08/05/14   Cresenzo-Dishmon, Joaquim Lai, CNM    Family History Family History  Problem Relation Age of Onset   Xeroderma pigmentosa Mother    Hyperthyroidism Mother    Hypertension Father    Heart disease Maternal Grandmother    Diabetes Maternal Grandmother    Cancer Maternal Grandmother        cervical, ovarian, colon    Social History Social History   Tobacco Use   Smoking status: Current Every Day Smoker    Packs/day: 0.50    Types: Cigarettes   Smokeless tobacco: Never Used  Substance Use Topics   Alcohol use: No    Alcohol/week: 0.0 standard  drinks   Drug use: No     Allergies   Patient has no known allergies.   Review of Systems Review of Systems  Gastrointestinal: Positive for abdominal pain, diarrhea and nausea.  All other systems reviewed and are negative.    Physical Exam Updated Vital Signs BP 129/81    Pulse 75    Temp 98.2 F (36.8 C) (Oral)    Resp 18    Ht 5\' 2"  (1.575 m)    Wt 83.6 kg    LMP 05/07/2019    SpO2 96%    BMI 33.69 kg/m   Physical Exam Vitals signs and nursing note reviewed.  Constitutional:      General: She is not in acute distress.    Appearance: Normal appearance. She is well-developed.  HENT:     Head: Normocephalic and atraumatic.     Right Ear: Hearing normal.     Left Ear: Hearing normal.     Nose: Nose normal.  Eyes:     Conjunctiva/sclera: Conjunctivae normal.      Pupils: Pupils are equal, round, and reactive to light.  Neck:     Musculoskeletal: Normal range of motion and neck supple.  Cardiovascular:     Rate and Rhythm: Regular rhythm.     Heart sounds: S1 normal and S2 normal. No murmur. No friction rub. No gallop.   Pulmonary:     Effort: Pulmonary effort is normal. No respiratory distress.     Breath sounds: Normal breath sounds.  Chest:     Chest wall: No tenderness.  Abdominal:     General: Bowel sounds are normal.     Palpations: Abdomen is soft.     Tenderness: There is abdominal tenderness in the right lower quadrant. There is guarding. There is no rebound. Positive signs include Murphy's sign. Negative signs include McBurney's sign.     Hernia: No hernia is present.  Musculoskeletal: Normal range of motion.  Skin:    General: Skin is warm and dry.     Findings: No rash.  Neurological:     Mental Status: She is alert and oriented to person, place, and time.     GCS: GCS eye subscore is 4. GCS verbal subscore is 5. GCS motor subscore is 6.     Cranial Nerves: No cranial nerve deficit.     Sensory: No sensory deficit.     Coordination: Coordination normal.  Psychiatric:        Speech: Speech normal.        Behavior: Behavior normal.        Thought Content: Thought content normal.      ED Treatments / Results  Labs (all labs ordered are listed, but only abnormal results are displayed) Labs Reviewed  COMPREHENSIVE METABOLIC PANEL - Abnormal; Notable for the following components:      Result Value   Calcium 8.8 (*)    All other components within normal limits  CBC WITH DIFFERENTIAL/PLATELET - Abnormal; Notable for the following components:   WBC 11.0 (*)    Eosinophils Absolute 0.7 (*)    All other components within normal limits  URINALYSIS, ROUTINE W REFLEX MICROSCOPIC - Abnormal; Notable for the following components:   Color, Urine STRAW (*)    All other components within normal limits  SARS CORONAVIRUS 2 (HOSPITAL  ORDER, PERFORMED IN [facility])  LIPASE, BLOOD  PREGNANCY, URINE  SURGICAL PATHOLOGY    EKG None  Radiology Ct Abdomen Pelvis W Contrast  Result  Date: 06/02/2019 CLINICAL DATA:  Abdominal pain, right-sided EXAM: CT ABDOMEN AND PELVIS WITH CONTRAST TECHNIQUE: Multidetector CT imaging of the abdomen and pelvis was performed using the standard protocol following bolus administration of intravenous contrast. CONTRAST:  OMNIPAQUE IOHEXOL 300 MG/ML  SOLN COMPARISON:  None. FINDINGS: Lower chest: The visualized heart size within normal limits. No pericardial fluid/thickening. No hiatal hernia. The visualized portions of the lungs are clear. Hepatobiliary: The liver is normal in density without focal abnormality.The main portal vein is patent. No evidence of calcified gallstones, gallbladder wall thickening or biliary dilatation. Pancreas: Unremarkable. No pancreatic ductal dilatation or surrounding inflammatory changes. Spleen: Normal in size without focal abnormality. Adrenals/Urinary Tract: Both adrenal glands appear normal. The kidneys and collecting system appear normal without evidence of urinary tract calculus or hydronephrosis. Bladder is unremarkable. Stomach/Bowel: The stomach is normal in appearance. There is a moderate to large amount of right colonic stool with fecalization of the terminal ileum. The appendix is seen in the right lower quadrant and appears to be mildly prominent measuring 6.5 mm. There is question of minimal right lower quadrant mesenteric fat stranding changes. Scattered small lymph nodes are seen within the anterior mesentery. Vascular/Lymphatic: There are no enlarged mesenteric, retroperitoneal, or pelvic lymph nodes. No significant vascular findings are present. Reproductive: There is a 2.9 cm low-density lesion within the right ovary, likely simple ovarian cyst. A small amount of fluid in the endometrial canal. Other: No evidence of abdominal wall mass or  hernia. Musculoskeletal: No acute or significant osseous findings. IMPRESSION: 1. Slightly prominent appendix with question of mild surrounding inflammatory changes which could be due to early appendicitis. 2. Moderate to large amount of right colonic stool with fecalization of the terminal ileum. Electronically Signed   By: Jonna Clark M.D.   On: 06/02/2019 02:05    Procedures Procedures (including critical care time)  Medications Ordered in ED Medications  sodium chloride 0.9 % bolus 500 mL (0 mLs Intravenous Stopped 06/02/19 0221)  ondansetron (ZOFRAN) injection 4 mg (4 mg Intravenous Given 06/01/19 2356)  morphine 4 MG/ML injection 4 mg (4 mg Intravenous Given 06/01/19 2356)  iohexol (OMNIPAQUE) 300 MG/ML solution 100 mL (100 mLs Intravenous Contrast Given 06/02/19 0152)  cefTRIAXone (ROCEPHIN) 2 g in sodium chloride 0.9 % 100 mL IVPB (0 g Intravenous Stopped 06/02/19 0656)    And  metroNIDAZOLE (FLAGYL) IVPB 500 mg (0 mg Intravenous Stopped 06/02/19 0334)  ketorolac (TORADOL) 30 MG/ML injection 30 mg (30 mg Intravenous Given 06/02/19 1035)  bupivacaine liposome (EXPAREL) 1.3 % injection (has no administration in time range)  propofol (DIPRIVAN) 10 mg/mL bolus/IV push (has no administration in time range)  fentaNYL (SUBLIMAZE) 250 MCG/5ML injection (has no administration in time range)  succinylcholine (ANECTINE) 200 MG/10ML syringe (has no administration in time range)  rocuronium bromide 100 MG/10ML SOSY (has no administration in time range)  dexamethasone (DECADRON) 10 MG/ML injection (has no administration in time range)  ondansetron (ZOFRAN) 4 MG/2ML injection (has no administration in time range)  fentaNYL (SUBLIMAZE) 100 MCG/2ML injection (has no administration in time range)  HYDROmorphone (DILAUDID) 1 MG/ML injection (has no administration in time range)     Initial Impression / Assessment and Plan / ED Course  I have reviewed the triage vital signs and the nursing  notes.  Pertinent labs & imaging results that were available during my care of the patient were reviewed by me and considered in my medical decision making (see chart for details).  Patient with progressively worsening right lower quadrant pain with guarding and tenderness in the right lower quadrant.  CT scan consistent with early appendicitis.  Final Clinical Impressions(s) / ED Diagnoses   Final diagnoses:  Acute appendicitis with localized peritonitis, without perforation, abscess, or gangrene    ED Discharge Orders         Ordered    Increase activity slowly     06/02/19 0959    Diet general     06/02/19 0959    HYDROcodone-acetaminophen (NORCO) 5-325 MG tablet  Every 4 hours PRN     06/02/19 0959           Gilda CreasePollina, Karrin Eisenmenger J, MD 06/03/19 [phone]

## 2019-06-02 NOTE — H&P (Signed)
Kathleen Lucas is an 23 y.o. female.   Chief Complaint: Right lower quadrant abdominal pain HPI: Patient is a 23 year old white female who presented emergency room with worsening right-sided abdominal pain.  CT scan of the abdomen reveals constipation with early acute appendicitis.  Patient states her pain is 3 out of 10.  Mild nausea is noted.  No emesis has been noted.  No past medical history on file.  Past Surgical History:  Procedure Laterality Date  . toe nail     ingrown toenails    Family History  Problem Relation Age of Onset  . Xeroderma pigmentosa Mother   . Hyperthyroidism Mother   . Hypertension Father   . Heart disease Maternal Grandmother   . Diabetes Maternal Grandmother   . Cancer Maternal Grandmother        cervical, ovarian, colon   Social History:  reports that she has been smoking cigarettes. She has been smoking about 0.50 packs per day. She has never used smokeless tobacco. She reports that she does not drink alcohol or use drugs.  Allergies: No Known Allergies  (Not in a hospital admission)   Results for orders placed or performed during the hospital encounter of 06/01/19 (from the past 48 hour(s))  Comprehensive metabolic panel     Status: Abnormal   Collection Time: 06/01/19 10:01 PM  Result Value Ref Range   Sodium 135 135 - 145 mmol/L   Potassium 3.7 3.5 - 5.1 mmol/L   Chloride 105 98 - 111 mmol/L   CO2 22 22 - 32 mmol/L   Glucose, Bld 93 70 - 99 mg/dL   BUN 9 6 - 20 mg/dL   Creatinine, Ser 8.290.67 0.44 - 1.00 mg/dL   Calcium 8.8 (L) 8.9 - 10.3 mg/dL   Total Protein 7.8 6.5 - 8.1 g/dL   Albumin 4.6 3.5 - 5.0 g/dL   AST 23 15 - 41 U/L   ALT 24 0 - 44 U/L   Alkaline Phosphatase 76 38 - 126 U/L   Total Bilirubin 0.3 0.3 - 1.2 mg/dL   GFR calc non Af Amer >60 >60 mL/min   GFR calc Af Amer >60 >60 mL/min   Anion gap 8 5 - 15    Comment: Performed at [facility], [address] [place], [place] [address]  Lipase, blood     Status: None   Collection Time: 06/01/19 10:01 PM  Result Value Ref Range   Lipase 28 11 - 51 U/L    Comment: Performed at [facility], [address] [place], [place] [address]  CBC with Diff     Status: Abnormal   Collection Time: 06/01/19 10:01 PM  Result Value Ref Range   WBC 11.0 (H) 4.0 - 10.5 K/uL   RBC 4.62 3.87 - 5.11 MIL/uL   Hemoglobin 13.6 12.0 - 15.0 g/dL   HCT 84.642.1 96.236.0 - 95.246.0 %   MCV 91.1 80.0 - 100.0 fL   MCH 29.4 26.0 - 34.0 pg   MCHC 32.3 30.0 - 36.0 g/dL   RDW 84.113.3 32.411.5 - 40.115.5 %   Platelets 298 150 - 400 K/uL   nRBC 0.0 0.0 - 0.2 %   Neutrophils Relative % 55 %   Neutro Abs 6.1 1.7 - 7.7 K/uL   Lymphocytes Relative 30 %   Lymphs Abs 3.3 0.7 - 4.0 K/uL   Monocytes Relative 8 %   Monocytes Absolute 0.9 0.1 - 1.0 K/uL   Eosinophils Relative 6 %   Eosinophils Absolute 0.7 (  H) 0.0 - 0.5 K/uL   Basophils Relative 1 %   Basophils Absolute 0.1 0.0 - 0.1 K/uL   Immature Granulocytes 0 %   Abs Immature Granulocytes 0.03 0.00 - 0.07 K/uL    Comment: Performed at [facility], [address] [place], [place] [address]  Urinalysis, Routine w reflex microscopic     Status: Abnormal   Collection Time: 06/01/19 10:01 PM  Result Value Ref Range   Color, Urine STRAW (A) YELLOW   APPearance CLEAR CLEAR   Specific Gravity, Urine 1.005 1.005 - 1.030   pH 6.0 5.0 - 8.0   Glucose, UA NEGATIVE NEGATIVE mg/dL   Hgb urine dipstick NEGATIVE NEGATIVE   Bilirubin Urine NEGATIVE NEGATIVE   Ketones, ur NEGATIVE NEGATIVE mg/dL   Protein, ur NEGATIVE NEGATIVE mg/dL   Nitrite NEGATIVE NEGATIVE   Leukocytes,Ua NEGATIVE NEGATIVE    Comment: Performed at [facility], [address], [place], [place] [address]  Pregnancy, urine     Status: None   Collection Time: 06/01/19 10:01 PM  Result Value Ref Range   Preg Test, Ur NEGATIVE NEGATIVE    Comment:        THE SENSITIVITY OF THIS METHODOLOGY IS >20 mIU/mL. Performed at [facility], [address] [place], [place] [address]   SARS Coronavirus 2  [facility] order, Performed in [facility] hospital lab) Nasopharyngeal Nasopharyngeal Swab     Status: None   Collection Time: 06/02/19  3:33 AM   Specimen: Nasopharyngeal Swab  Result Value Ref Range   SARS Coronavirus 2 NEGATIVE NEGATIVE    Comment: (NOTE) If result is NEGATIVE SARS-CoV-2 target nucleic acids are NOT DETECTED. The SARS-CoV-2 RNA is generally detectable in upper and lower  respiratory specimens during the acute phase of infection. The lowest  concentration of SARS-CoV-2 viral copies this assay can detect is 250  copies / mL. A negative result does not preclude SARS-CoV-2 infection  and should not be used as the sole basis for treatment or other  patient management decisions.  A negative result may occur with  improper specimen collection / handling, submission of specimen other  than nasopharyngeal swab, presence of viral mutation(s) within the  areas targeted by this assay, and inadequate number of viral copies  (<250 copies / mL). A negative result must be combined with clinical  observations, patient history, and epidemiological information. If result is POSITIVE SARS-CoV-2 target nucleic acids are DETECTED. The SARS-CoV-2 RNA is generally detectable in upper and lower  respiratory specimens dur ing the acute phase of infection.  Positive  results are indicative of active infection with SARS-CoV-2.  Clinical  correlation with patient history and other diagnostic information is  necessary to determine patient infection status.  Positive results do  not rule out bacterial infection or co-infection with other viruses. If result is PRESUMPTIVE POSTIVE SARS-CoV-2 nucleic acids MAY BE PRESENT.   A presumptive positive result was obtained on the submitted specimen  and confirmed on repeat testing.  While 2019 novel coronavirus  (SARS-CoV-2) nucleic acids may be present in the submitted sample  additional confirmatory testing may be necessary for epidemiological  and /  or clinical management purposes  to differentiate between  SARS-CoV-2 and other Sarbecovirus currently known to infect humans.  If clinically indicated additional testing with an alternate test  methodology [phone]) is advised. The SARS-CoV-2 RNA is generally  detectable in upper and lower respiratory sp ecimens during the acute  phase of infection. The expected result is Negative. Fact Sheet  for Patients:  BoilerBrush.com.cy Fact Sheet for Healthcare Providers: https://pope.com/ This test is not yet approved or cleared by the [place] FDA and has been authorized for detection and/or diagnosis of SARS-CoV-2 by FDA under an Emergency Use Authorization (EUA).  This EUA will remain in effect (meaning this test can be used) for the duration of the COVID-19 declaration under Section 564(b)(1) of the Act, 21 U.S.C. section 360bbb-3(b)(1), unless the authorization is terminated or revoked sooner. Performed at [facility], [address], [place], [place] [address]    Ct Abdomen Pelvis W Contrast  Result Date: 06/02/2019 CLINICAL DATA:  Abdominal pain, right-sided EXAM: CT ABDOMEN AND PELVIS WITH CONTRAST TECHNIQUE: Multidetector CT imaging of the abdomen and pelvis was performed using the standard protocol following bolus administration of intravenous contrast. CONTRAST:  OMNIPAQUE IOHEXOL 300 MG/ML  SOLN COMPARISON:  None. FINDINGS: Lower chest: The visualized heart size within normal limits. No pericardial fluid/thickening. No hiatal hernia. The visualized portions of the lungs are clear. Hepatobiliary: The liver is normal in density without focal abnormality.The main portal vein is patent. No evidence of calcified gallstones, gallbladder wall thickening or biliary dilatation. Pancreas: Unremarkable. No pancreatic ductal dilatation or surrounding inflammatory changes. Spleen: Normal in size without focal abnormality. Adrenals/Urinary Tract:  Both adrenal glands appear normal. The kidneys and collecting system appear normal without evidence of urinary tract calculus or hydronephrosis. Bladder is unremarkable. Stomach/Bowel: The stomach is normal in appearance. There is a moderate to large amount of right colonic stool with fecalization of the terminal ileum. The appendix is seen in the right lower quadrant and appears to be mildly prominent measuring 6.5 mm. There is question of minimal right lower quadrant mesenteric fat stranding changes. Scattered small lymph nodes are seen within the anterior mesentery. Vascular/Lymphatic: There are no enlarged mesenteric, retroperitoneal, or pelvic lymph nodes. No significant vascular findings are present. Reproductive: There is a 2.9 cm low-density lesion within the right ovary, likely simple ovarian cyst. A small amount of fluid in the endometrial canal. Other: No evidence of abdominal wall mass or hernia. Musculoskeletal: No acute or significant osseous findings. IMPRESSION: 1. Slightly prominent appendix with question of mild surrounding inflammatory changes which could be due to early appendicitis. 2. Moderate to large amount of right colonic stool with fecalization of the terminal ileum. Electronically Signed   By: Jonna Clark M.D.   On: 06/02/2019 02:05    Review of Systems  Constitutional: Positive for malaise/fatigue.  HENT: Negative.   Eyes: Negative.   Respiratory: Negative.   Cardiovascular: Negative.   Gastrointestinal: Positive for abdominal pain.  Genitourinary: Negative.   Musculoskeletal: Negative.   Skin: Negative.   Neurological: Negative.   Endo/Heme/Allergies: Negative.   Psychiatric/Behavioral: Negative.     Blood pressure 102/62, pulse 78, temperature 98 F (36.7 C), resp. rate 18, height 5\' 2"  (1.575 m), weight 83.6 kg, last menstrual period 05/07/2019, SpO2 99 %. Physical Exam  Constitutional: She is oriented to person, place, and time. She appears well-developed and  well-nourished. No distress.  HENT:  Head: Normocephalic and atraumatic.  Cardiovascular: Normal rate, regular rhythm and normal heart sounds. Exam reveals no gallop and no friction rub.  No murmur heard. Respiratory: Effort normal and breath sounds normal. No respiratory distress. She has no wheezes. She has no rales.  GI: Soft. Bowel sounds are normal. She exhibits no distension. There is abdominal tenderness. There is no rebound and no guarding.  Tender in the right lower quadrant over McBurney's point.  No  rigidity is noted.  Neurological: She is oriented to person, place, and time.  Skin: Skin is warm and dry.    CT scan images personally reviewed Assessment/Plan Impression: Acute appendicitis Plan: Patient will be taken to the operating room today for laparoscopic appendectomy.  The risks and benefits of the procedure including bleeding, infection, and the possibility of an open procedure were fully explained to the patient, who gave informed consent.  Franky Macho, MD 06/02/2019, 8:25 AM

## 2019-06-02 NOTE — ED Notes (Signed)
Patient transported to CT 

## 2019-06-02 NOTE — Anesthesia Postprocedure Evaluation (Signed)
Anesthesia Post Note  Patient: [place]  Procedure(s) Performed: APPENDECTOMY LAPAROSCOPIC (N/A )  Patient location during evaluation: PACU Anesthesia Type: General Level of consciousness: awake, awake and alert and oriented Pain management: pain level controlled Vital Signs Assessment: post-procedure vital signs reviewed and stable Respiratory status: spontaneous breathing and nonlabored ventilation Cardiovascular status: blood pressure returned to baseline and stable Postop Assessment: no headache Anesthetic complications: no Comments: Did well -plan is d/c home if tolerated      Last Vitals:  Vitals:   06/02/19 0955 06/02/19 1000  BP: 133/63 125/64  Pulse: 99 95  Resp: 14 20  Temp: 36.6 C   SpO2: 96% 96%    Last Pain:  Vitals:   06/02/19 0704  TempSrc:   PainSc: Asleep                 Lenice Llamas

## 2019-06-02 NOTE — Progress Notes (Signed)
Due to Covid Restrictions  discharge instructions and information given to spouse and patient together upon discharge.

## 2019-06-02 NOTE — Discharge Instructions (Signed)
Laparoscopic Appendectomy, Adult, Care After °This sheet gives you information about how to care for yourself after your procedure. Your health care provider may also give you more specific instructions. If you have problems or questions, contact your health care provider. °What can I expect after the procedure? °After the procedure, it is common to have: °· Little energy for normal activities. °· Mild pain in the area where the incisions were made. °· Difficulty passing stool (constipation). This can be caused by: °? Pain medicine. °? A decrease in your activity. °Follow these instructions at home: °Medicines °· Take over-the-counter and prescription medicines only as told by your health care provider. °· If you were prescribed an antibiotic medicine, take it as told by your health care provider. Do not stop taking the antibiotic even if you start to feel better. °· Do not drive or use heavy machinery while taking prescription pain medicine. °· Ask your health care provider if the medicine prescribed to you can cause constipation. You may need to take steps to prevent or treat constipation, such as: °? Drink enough fluid to keep your urine pale yellow. °? Take over-the-counter or prescription medicines. °? Eat foods that are high in fiber, such as beans, whole grains, and fresh fruits and vegetables. °? Limit foods that are high in fat and processed sugars, such as fried or sweet foods. °Incision care ° °· Follow instructions from your health care provider about how to take care of your incisions. Make sure you: °? Wash your hands with soap and water before and after you change your bandage (dressing). If soap and water are not available, use hand sanitizer. °? Change your dressing as told by your health care provider. °? Leave stitches (sutures), skin glue, or adhesive strips in place. These skin closures may need to stay in place for 2 weeks or longer. If adhesive strip edges start to loosen and curl up, you may  trim the loose edges. Do not remove adhesive strips completely unless your health care provider tells you to do that. °· Check your incision areas every day for signs of infection. Check for: °? Redness, swelling, or pain. °? Fluid or blood. °? Warmth. °? Pus or a bad smell. °Bathing °· Keep your incisions clean and dry. Clean them as often as told by your health care provider. To do this: °1. Gently wash the incisions with soap and water. °2. Rinse the incisions with water to remove all soap. °3. Pat the incisions dry with a clean towel. Do not rub the incisions. °· Do not take baths, swim, or use a hot tub for 2 weeks, or until your health care provider approves. You may take showers after 48 hours. °Activity ° °· Do not drive for 24 hours if you were given a sedative during your procedure. °· Rest after the procedure. Return to your normal activities as told by your health care provider. Ask your health care provider what activities are safe for you. °· For 3 weeks, or for as [place] as told by your health care provider: °? Do not lift anything that is heavier than 10 lb (4.5 kg), or the limit that you are told. °? Do not play contact sports. °General instructions °· If you were sent home with a drain, follow instructions from your health care provider about how to care for it. °· Take deep breaths. This helps to prevent your lungs from developing an infection (pneumonia). °· Keep all follow-up visits as told by your health   care provider. This is important. °Contact a health care provider if: °· You have redness, swelling, or pain around an incision. °· You have fluid or blood coming from an incision. °· Your incision feels warm to the touch. °· You have pus or a bad smell coming from an incision or dressing. °· Your incision edges break open after your sutures have been removed. °· You have increasing pain in your shoulders. °· You feel dizzy or you faint. °· You develop shortness of breath. °· You keep feeling  nauseous or you are vomiting. °· You have diarrhea or you cannot control your bowel functions. °· You lose your appetite. °· You develop swelling or pain in your legs. °· You develop a rash. °Get help right away if you have: °· A fever. °· Difficulty breathing. °· Sharp pains in your chest. °Summary °· After a laparoscopic appendectomy, it is common to have little energy for normal activities, mild pain in the area of the incisions, and constipation. °· Infection is the most common complication after this procedure. Follow your health care provider's instructions about caring for yourself after the procedure. °· Rest after the procedure. Return to your normal activities as told by your health care provider. °· Contact your health care provider if you notice signs of infection around your incisions or you develop shortness of breath. Get help right away if you have a fever, chest pain, or difficulty breathing. °This information is not intended to replace advice given to you by your health care provider. Make sure you discuss any questions you have with your health care provider. °Document Released: 08/15/2005 Document Revised: 02/15/2018 Document Reviewed: 02/15/2018 °Elsevier Patient Education © 2020 [employer] Inc. ° °

## 2019-06-02 NOTE — Anesthesia Preprocedure Evaluation (Signed)
Anesthesia Evaluation  Patient identified by MRN, date of birth, ID band Patient awake    Reviewed: Allergy & Precautions, NPO status , Patient's Chart, lab work & pertinent test results  Airway Mallampati: II  TM Distance: >3 FB Neck ROM: Full    Dental no notable dental hx. (+) Teeth Intact   Pulmonary Current Smoker,    Pulmonary exam normal breath sounds clear to auscultation       Cardiovascular Exercise Tolerance: Good negative cardio ROS Normal cardiovascular examI Rhythm:Regular Rate:Normal     Neuro/Psych negative neurological ROS  negative psych ROS   GI/Hepatic negative GI ROS, Neg liver ROS,   Endo/Other  negative endocrine ROS  Renal/GU negative Renal ROS  negative genitourinary   Musculoskeletal negative musculoskeletal ROS (+)   Abdominal   Peds negative pediatric ROS (+)  Hematology negative hematology ROS (+)   Anesthesia Other Findings den  Reproductive/Obstetrics negative OB ROS                             Anesthesia Physical Anesthesia Plan  ASA: II and emergent  Anesthesia Plan: General   Post-op Pain Management:    Induction: Intravenous  PONV Risk Score and Plan: 3 and Ondansetron, Dexamethasone, Treatment may vary due to age or medical condition, TIVA and Midazolam  Airway Management Planned: Oral ETT  Additional Equipment:   Intra-op Plan:   Post-operative Plan: Extubation in OR  Informed Consent: I have reviewed the patients History and Physical, chart, labs and discussed the procedure including the risks, benefits and alternatives for the proposed anesthesia with the patient or authorized representative who has indicated his/her understanding and acceptance.     Dental advisory given  Plan Discussed with:   Anesthesia Plan Comments: (Plan Full PPE use Plan GETA D/W PT -WTP with same after Q&A  All times with the exception of Anesthesia  start, Handoff and Anesthesia stop are not precise to the minute,but approximate   to the event. This chart was not reviewed to correct artifact data errors inherent with the system.)        Anesthesia Quick Evaluation

## 2019-06-02 NOTE — Transfer of Care (Signed)
Immediate Anesthesia Transfer of Care Note  Patient: [place]  Procedure(s) Performed: APPENDECTOMY LAPAROSCOPIC (N/A )  Patient Location: PACU  Anesthesia Type:General  Level of Consciousness: awake, alert  and oriented  Airway & Oxygen Therapy: Patient Spontanous Breathing and Patient connected to nasal cannula oxygen  Post-op Assessment: Report given to RN and Post -op Vital signs reviewed and stable  Post vital signs: Reviewed and stable  Last Vitals:  Vitals Value Taken Time  BP 125/64 06/02/19 1000  Temp 36.6 C 06/02/19 0955  Pulse 90 06/02/19 1005  Resp 16 06/02/19 1005  SpO2 98 % 06/02/19 1005  Vitals shown include unvalidated device data.  Last Pain:  Vitals:   06/02/19 0704  TempSrc:   PainSc: Asleep         Complications: No apparent anesthesia complications

## 2019-06-02 NOTE — Anesthesia Procedure Notes (Signed)
Procedure Name: Intubation Date/Time: 06/02/2019 9:15 AM Performed by: Lenice Llamas, MD Pre-anesthesia Checklist: Patient identified, Patient being monitored, Timeout performed, Emergency Drugs available and Suction available Patient Re-evaluated:Patient Re-evaluated prior to induction Oxygen Delivery Method: Circle System Utilized Preoxygenation: Pre-oxygenation with 100% oxygen Induction Type: IV induction, Rapid sequence and Cricoid Pressure applied Laryngoscope Size: Mac and 4 Grade View: Grade I Tube type: Oral Tube size: 7.0 mm Number of attempts: 1 Airway Equipment and Method: Stylet Placement Confirmation: ETT inserted through vocal cords under direct vision,  positive ETCO2 and breath sounds checked- equal and bilateral Secured at: 21 cm Tube secured with: Tape Dental Injury: Teeth and Oropharynx as per pre-operative assessment

## 2019-06-03 ENCOUNTER — Encounter (HOSPITAL_COMMUNITY): Payer: Self-pay | Admitting: General Surgery

## 2019-06-04 LAB — SURGICAL PATHOLOGY

## 2019-06-07 ENCOUNTER — Telehealth: Payer: Self-pay | Admitting: General Surgery

## 2019-06-07 ENCOUNTER — Telehealth (INDEPENDENT_AMBULATORY_CARE_PROVIDER_SITE_OTHER): Payer: Self-pay | Admitting: General Surgery

## 2019-06-07 ENCOUNTER — Other Ambulatory Visit: Payer: Self-pay

## 2019-06-07 DIAGNOSIS — Z09 Encounter for follow-up examination after completed treatment for conditions other than malignant neoplasm: Secondary | ICD-10-CM

## 2019-06-07 NOTE — Telephone Encounter (Signed)
Virtual visit performed.  Patient doing very well.  Has no incisional pain.  She is moving her bowels without difficulty.  I told her to call our office should any problems arise.  Her preoperative pain has resolved.

## 2022-02-04 ENCOUNTER — Other Ambulatory Visit: Payer: Self-pay

## 2022-02-04 ENCOUNTER — Emergency Department (HOSPITAL_COMMUNITY): Payer: Self-pay

## 2022-02-04 ENCOUNTER — Emergency Department (HOSPITAL_COMMUNITY)
Admission: EM | Admit: 2022-02-04 | Discharge: 2022-02-04 | Disposition: A | Discharge status: Home / Self Care | Payer: Self-pay | Attending: Emergency Medicine | Admitting: Emergency Medicine

## 2022-02-04 ENCOUNTER — Encounter (HOSPITAL_COMMUNITY): Payer: Self-pay | Admitting: Emergency Medicine

## 2022-02-04 DIAGNOSIS — Z2914 Encounter for prophylactic rabies immune globin: Secondary | ICD-10-CM | POA: Insufficient documentation

## 2022-02-04 DIAGNOSIS — S60931A Unspecified superficial injury of right thumb, initial encounter: Secondary | ICD-10-CM | POA: Insufficient documentation

## 2022-02-04 DIAGNOSIS — W540XXA Bitten by dog, initial encounter: Secondary | ICD-10-CM | POA: Insufficient documentation

## 2022-02-04 DIAGNOSIS — S51831A Puncture wound without foreign body of right forearm, initial encounter: Secondary | ICD-10-CM | POA: Insufficient documentation

## 2022-02-04 DIAGNOSIS — Z203 Contact with and (suspected) exposure to rabies: Secondary | ICD-10-CM | POA: Insufficient documentation

## 2022-02-04 DIAGNOSIS — Z23 Encounter for immunization: Secondary | ICD-10-CM | POA: Insufficient documentation

## 2022-02-04 LAB — POC URINE PREG, ED: Preg Test, Ur: NEGATIVE

## 2022-02-04 MED ORDER — RABIES IMMUNE GLOBULIN 150 UNIT/ML IM INJ
INJECTION | INTRAMUSCULAR | Status: AC
  Filled 2022-02-04: qty 2

## 2022-02-04 MED ORDER — TETANUS-DIPHTH-ACELL PERTUSSIS 5-2.5-18.5 LF-MCG/0.5 IM SUSY
0.5000 mL | PREFILLED_SYRINGE | Freq: Once | INTRAMUSCULAR | Status: AC
  Administered 2022-02-04: 0.5 mL via INTRAMUSCULAR
  Filled 2022-02-04: qty 0.5

## 2022-02-04 MED ORDER — RABIES VACCINE, PCEC IM SUSR
1.0000 mL | Freq: Once | INTRAMUSCULAR | Status: AC
  Administered 2022-02-04: 1 mL via INTRAMUSCULAR
  Filled 2022-02-04: qty 1

## 2022-02-04 MED ORDER — RABIES IMMUNE GLOBULIN 150 UNIT/ML IM INJ
20.0000 [IU]/kg | INJECTION | Freq: Once | INTRAMUSCULAR | Status: DC
  Filled 2022-02-04: qty 10

## 2022-02-04 MED ORDER — RABIES IMMUNE GLOBULIN 150 UNIT/ML IM INJ
20.0000 [IU]/kg | INJECTION | Freq: Once | INTRAMUSCULAR | Status: AC
  Administered 2022-02-04: 1500 [IU] via INTRAMUSCULAR
  Filled 2022-02-04: qty 10

## 2022-02-04 MED ORDER — AMOXICILLIN-POT CLAVULANATE 875-125 MG PO TABS
1.0000 | ORAL_TABLET | Freq: Once | ORAL | Status: AC
  Administered 2022-02-04: 1 via ORAL
  Filled 2022-02-04: qty 1

## 2022-02-04 MED ORDER — AMOXICILLIN-POT CLAVULANATE 875-125 MG PO TABS: 1.0000 | ORAL_TABLET | Freq: Two times a day (BID) | ORAL | 0 refills | Status: DC

## 2022-02-04 NOTE — ED Notes (Signed)
ED Provider at bedside. 

## 2022-02-04 NOTE — Discharge Instructions (Signed)
Take Augmentin twice daily.  First dose was given today. You will need to follow-up with hand given the dog bite.  Please call to schedule an appointment for follow-up.                                  RABIES VACCINE FOLLOW UP  Patient's Name: Kathleen Lucas                     Original Order Date:02/04/2022  Medical Record Number: [id number]  ED Physician: Cheryll Cockayne, MD Primary Diagnosis: Rabies Exposure       PCP: Patient, No Pcp Per (Inactive)  Patient Phone Number: (home) [phone] (home)    (cell)  No relevant phone numbers on file.    (work) There is no work phone number on file. Species of Animal: Dog   You have been seen in the Emergency Department for a possible rabies exposure. It's very important you return for the additional vaccine doses.  Please call the clinic listed below for hours of operation.   Clinic that will administer your rabies vaccines: Other (Comment) (Pt unsure where she will go)  DAY 0:  02/04/2022      DAY 3:  02/07/2022       DAY 7:  02/11/2022     DAY 14:  02/18/2022         The 5th vaccine injection is considered for immune compromised patients only.  DAY 28:  03/04/2022

## 2022-02-04 NOTE — ED Provider Notes (Signed)
[facility] EMERGENCY DEPARTMENT Provider Note   CSN: [id number] Arrival date & time: 02/04/22  1814     History  Chief Complaint  Patient presents with   Animal Bite    Kathleen Lucas is a 26 y.o. female.   Animal Bite   Patient presents due to dog bite.  This happened at 8 AM yesterday, it was a stray dog and she is unaware of its vaccination status.  She was bit in the face, right thumb, right forearm.  She is right-hand dominant.  No allergies antibiotics, unsure when her last tetanus update was.  She states she is having swelling to the right thumb and pain and warmth to the right forearm where she has a puncture bite.  Home Medications Prior to Admission medications   Medication Sig Start Date End Date Taking? Authorizing Provider  amoxicillin-clavulanate (AUGMENTIN) 875-125 MG tablet Take 1 tablet by mouth every 12 (twelve) hours. 02/04/22  Yes Theron Arista, PA-C  albuterol (PROVENTIL HFA;VENTOLIN HFA) 108 (90 Base) MCG/ACT inhaler Inhale 1-2 puffs into the lungs every 6 (six) hours as needed for wheezing or shortness of breath. 05/12/16   Elson Areas, PA-C  azithromycin (ZITHROMAX) 250 MG tablet Take 1 tablet (250 mg total) by mouth daily. Take first 2 tablets together, then 1 every day until finished. 05/12/16   Elson Areas, PA-C  clotrimazole (GYNE-LOTRIMIN) 1 % vaginal cream Place 1 Applicatorful vaginally 2 (two) times daily.    [provider]  cyclobenzaprine (FLEXERIL) 5 MG tablet Take 1 tablet (5 mg total) by mouth 3 (three) times daily as needed for muscle spasms. 04/04/16   Burgess Amor, PA-C  HYDROcodone-acetaminophen (NORCO) 5-325 MG tablet Take 1 tablet by mouth every 4 (four) hours as needed for moderate pain. 06/02/19   Franky Macho, MD  ibuprofen (ADVIL,MOTRIN) 600 MG tablet Take 1 tablet (600 mg total) by mouth every 6 (six) hours as needed. 04/04/16   Burgess Amor, PA-C  naproxen (NAPROSYN) 500 MG tablet Take 1 tablet (500 mg total) by mouth 2 (two)  times daily. 12/30/13   Rolland Porter, MD  norgestimate-ethinyl estradiol (ORTHO-CYCLEN,SPRINTEC,PREVIFEM) 0.25-35 MG-MCG tablet Take 1 tablet by mouth daily. 08/05/14   Cresenzo-Dishmon, Scarlette Calico, CNM      Allergies    Fish allergy    Review of Systems   Review of Systems  Physical Exam Updated Vital Signs BP 120/72   Pulse 88   Temp 98.6 F (37 C) (Oral)   Resp 16   Ht 5\' 2"  (1.575 m)   Wt 73.5 kg   LMP 01/20/2022 (Approximate)   SpO2 100%   BMI 29.63 kg/m  Physical Exam Vitals and nursing note reviewed. Exam conducted with a chaperone present.  Constitutional:      General: She is not in acute distress.    Appearance: Normal appearance.  HENT:     Head: Normocephalic and atraumatic.  Eyes:     General: No scleral icterus.    Extraocular Movements: Extraocular movements intact.     Pupils: Pupils are equal, round, and reactive to light.  Cardiovascular:     Pulses: Normal pulses.  Musculoskeletal:        General: Swelling present.     Comments: Erythema to right thumb, able to flex and extend.  Skin:    General: Skin is warm.     Coloration: Skin is not jaundiced.     Findings: Erythema present.     Comments: Puncture wound to right forearm.  Neurological:  Mental Status: She is alert. Mental status is at baseline.     Coordination: Coordination normal.     Comments: Sensation to light touch grossly intact to right upper extremity     ED Results / Procedures / Treatments   Labs (all labs ordered are listed, but only abnormal results are displayed) Labs Reviewed  POC URINE PREG, ED    EKG None  Radiology DG Hand Complete Right  Result Date: 02/04/2022 CLINICAL DATA:  Animal bite. EXAM: RIGHT HAND - COMPLETE 3+ VIEW; RIGHT FOREARM - 2 VIEW COMPARISON:  None Available. FINDINGS: There is no evidence of fracture or dislocation. There is no evidence of arthropathy or other focal bone abnormality. No radiopaque foreign body is seen. Soft tissues are  unremarkable. IMPRESSION: Negative. Electronically Signed   By: Thornell SartoriusLaura  Taylor M.D.   On: 02/04/2022 21:14   DG Forearm Right  Result Date: 02/04/2022 CLINICAL DATA:  Animal bite. EXAM: RIGHT HAND - COMPLETE 3+ VIEW; RIGHT FOREARM - 2 VIEW COMPARISON:  None Available. FINDINGS: There is no evidence of fracture or dislocation. There is no evidence of arthropathy or other focal bone abnormality. No radiopaque foreign body is seen. Soft tissues are unremarkable. IMPRESSION: Negative. Electronically Signed   By: Thornell SartoriusLaura  Taylor M.D.   On: 02/04/2022 21:14    Procedures Procedures    Medications Ordered in ED Medications  amoxicillin-clavulanate (AUGMENTIN) 875-125 MG per tablet 1 tablet (1 tablet Oral Given 02/04/22 2037)  Tdap (BOOSTRIX) injection 0.5 mL (0.5 mLs Intramuscular Given 02/04/22 2037)  rabies vaccine (RABAVERT) injection 1 mL (1 mL Intramuscular Given 02/04/22 2038)  rabies immune globulin (HYPERAB/KEDRAB) injection 1,500 Units (1,500 Units Intramuscular Given 02/04/22 2052)    ED Course/ Medical Decision Making/ A&P                           Medical Decision Making Amount and/or Complexity of Data Reviewed Radiology: ordered.  Risk Prescription drug management.   Patient presents due to dog bite.    On exam she is neurovascular intact with brisk cap refill.  She is able to flex and extend at the thumb.  There is circumferential swelling and erythema, the thumb is warm to touch.  I am concerned about infection, I do not think this is flexor tenosynovitis given full ROM and no deficit in sensation.  Is also brisk cap refill and radial pulses 2+.  She has another laceration/puncture wound to the right forearm which is left open.  There is some surrounding erythema as well but no purulent discharge.  No tenderness at all to the elbow, there is some slight abrasions to the right side of her face but no significant puncture wounds.  X-rays were obtained of hand and forearm, negative for any  retained foreign bodies.  Wound was irrigated extensively, rabies vaccination given.  Augmentin given.  We discussed follow-up plan, return precautions and patient will follow-up with hand.  Information provided.  Patient was discharged in stable condition.        Final Clinical Impression(s) / ED Diagnoses Final diagnoses:  Dog bite, initial encounter    Rx / DC Orders ED Discharge Orders          Ordered    amoxicillin-clavulanate (AUGMENTIN) 875-125 MG tablet  Every 12 hours        02/04/22 2117              Theron AristaSage, Crista Nuon, PA-C 02/04/22 2256    ChuichuHong,  Eustace Moore, MD 02/14/22 [phone]

## 2022-02-04 NOTE — ED Triage Notes (Signed)
Pt bit by stray dog yesterday to right side of face (bite marks with bruising), right anterior fa (puncture wound with redness and swelling noted)and right thumb (swelling noted).

## 2022-02-07 ENCOUNTER — Other Ambulatory Visit: Payer: Self-pay

## 2022-02-07 ENCOUNTER — Encounter (HOSPITAL_COMMUNITY): Payer: Self-pay | Admitting: Emergency Medicine

## 2022-02-07 ENCOUNTER — Emergency Department (HOSPITAL_COMMUNITY)
Admission: EM | Admit: 2022-02-07 | Discharge: 2022-02-07 | Disposition: A | Discharge status: Home / Self Care | Payer: Medicaid Other | Attending: Emergency Medicine | Admitting: Emergency Medicine

## 2022-02-07 DIAGNOSIS — Z203 Contact with and (suspected) exposure to rabies: Secondary | ICD-10-CM | POA: Insufficient documentation

## 2022-02-07 DIAGNOSIS — Z23 Encounter for immunization: Secondary | ICD-10-CM | POA: Insufficient documentation

## 2022-02-07 MED ORDER — RABIES VACCINE, PCEC IM SUSR
1.0000 mL | Freq: Once | INTRAMUSCULAR | Status: AC
  Administered 2022-02-07: 1 mL via INTRAMUSCULAR
  Filled 2022-02-07: qty 1

## 2022-02-07 NOTE — ED Triage Notes (Signed)
Pt states she's here for a 2nd dose of Rabies vaccine.

## 2022-02-07 NOTE — ED Provider Notes (Signed)
[facility] EMERGENCY DEPARTMENT Provider Note   CSN: [id number] Arrival date & time: 02/07/22  1342     History  Chief Complaint  Patient presents with   Rabies Vaccine    Kathleen Lucas is a 26 y.o. female.  HPI     Kathleen Lucas is a 26 y.o. female who presents to the Emergency Department requesting second rabies vaccine.  She was seen here 3 days ago for dog bite of her right thumb.  States the wounds are healing well and she has more mobility of the thumb than when previously seen.  She is currently taking antibiotics. States she is only here for her second rabies vaccination.   Home Medications Prior to Admission medications   Medication Sig Start Date End Date Taking? Authorizing Provider  albuterol (PROVENTIL HFA;VENTOLIN HFA) 108 (90 Base) MCG/ACT inhaler Inhale 1-2 puffs into the lungs every 6 (six) hours as needed for wheezing or shortness of breath. 05/12/16   Fransico Meadow, PA-C  amoxicillin-clavulanate (AUGMENTIN) 875-125 MG tablet Take 1 tablet by mouth every 12 (twelve) hours. 02/04/22   Sherrill Raring, PA-C  azithromycin (ZITHROMAX) 250 MG tablet Take 1 tablet (250 mg total) by mouth daily. Take first 2 tablets together, then 1 every day until finished. 05/12/16   Fransico Meadow, PA-C  clotrimazole (GYNE-LOTRIMIN) 1 % vaginal cream Place 1 Applicatorful vaginally 2 (two) times daily.    [provider]  cyclobenzaprine (FLEXERIL) 5 MG tablet Take 1 tablet (5 mg total) by mouth 3 (three) times daily as needed for muscle spasms. 04/04/16   Evalee Jefferson, PA-C  HYDROcodone-acetaminophen (NORCO) 5-325 MG tablet Take 1 tablet by mouth every 4 (four) hours as needed for moderate pain. 06/02/19   Aviva Signs, MD  ibuprofen (ADVIL,MOTRIN) 600 MG tablet Take 1 tablet (600 mg total) by mouth every 6 (six) hours as needed. 04/04/16   Evalee Jefferson, PA-C  naproxen (NAPROSYN) 500 MG tablet Take 1 tablet (500 mg total) by mouth 2 (two) times daily. 12/30/13   Tanna Furry, MD   norgestimate-ethinyl estradiol (ORTHO-CYCLEN,SPRINTEC,PREVIFEM) 0.25-35 MG-MCG tablet Take 1 tablet by mouth daily. 08/05/14   Cresenzo-Dishmon, Joaquim Lai, CNM      Allergies    Fish allergy    Review of Systems   Review of Systems  Constitutional:  Negative for chills and fever.  Respiratory:  Negative for shortness of breath.   Cardiovascular:  Negative for chest pain.  Gastrointestinal:  Negative for nausea and vomiting.  Musculoskeletal:  Negative for arthralgias.  Skin:  Negative for color change and pallor.       Dog bite of the right thumb, forearm and right face.   Neurological:  Negative for dizziness, weakness and numbness.    Physical Exam Updated Vital Signs BP 115/61   Pulse 71   Temp 98 F (36.7 C) (Oral)   Resp 16   Ht 5\' 2"  (1.575 m)   Wt 73.5 kg   LMP 01/20/2022 (Approximate)   SpO2 97%   BMI 29.64 kg/m  Physical Exam Vitals and nursing note reviewed.  Constitutional:      General: She is not in acute distress.    Appearance: Normal appearance. She is not toxic-appearing.  Cardiovascular:     Rate and Rhythm: Normal rate and regular rhythm.     Pulses: Normal pulses.  Pulmonary:     Effort: Pulmonary effort is normal.     Breath sounds: Normal breath sounds.  Musculoskeletal:  General: No swelling, tenderness or deformity.     Comments: Patient has full range of motion of the right thumb.  Normal finger thumb opposition.  Right wrist nontender.  Skin:    General: Skin is warm.     Capillary Refill: Capillary refill takes less than 2 seconds.     Findings: No erythema or rash.     Comments: Puncture wounds of the right mid thumb, mid forearm and right face.  No surrounding erythema or edema.  No purulent drainage noted.  Wounds appear to be healing well.  Neurological:     General: No focal deficit present.     Mental Status: She is alert.     Sensory: No sensory deficit.     Motor: No weakness.     ED Results / Procedures / Treatments    Labs (all labs ordered are listed, but only abnormal results are displayed) Labs Reviewed - No data to display  EKG None  Radiology No results found.  Procedures Procedures    Medications Ordered in ED Medications  rabies vaccine (RABAVERT) injection 1 mL (has no administration in time range)    ED Course/ Medical Decision Making/ A&P                           Medical Decision Making Risk Prescription drug management.   Patient here for second rabies vaccine.  Seen here 3 days ago and had initial vaccine and immunoglobulin.  Treatment secondary to dog bite.  Is she currently taking Augmentin without difficulty.  On exam, wounds appear to be healing well.  There is no clinical signs of cellulitis.  Reports increased range of motion of the thumb and wrist   We will administer second rabies vaccine, patient agreeable to remaining vaccines to be administered at the local urgent care as scheduled         Final Clinical Impression(s) / ED Diagnoses Final diagnoses:  Encounter for repeat administration of rabies vaccination    Rx / DC Orders ED Discharge Orders     None         Kem Parkinson, PA-C 02/07/22 1452    Godfrey Pick, MD 02/09/22 0845

## 2022-02-07 NOTE — Discharge Instructions (Signed)
You have been given your second dose of rabies vaccine today.  You have 2 additional vaccines remaining.  You may follow-up with the local urgent care to have those as scheduled.  Continue to take your antibiotics as directed.  Follow-up with your primary care provider for recheck.  Return to emergency department for any new or worsening symptoms.

## 2022-02-08 ENCOUNTER — Telehealth: Payer: Self-pay | Admitting: Orthopedic Surgery

## 2022-02-08 NOTE — Telephone Encounter (Signed)
Called patient per Dr Mort Sawyers message to offer today, 02/08/22, as cannot see 02/11/22. Left voice message offering appointment.

## 2022-02-08 NOTE — Telephone Encounter (Signed)
-----   Message from Vickki Hearing, MD sent at 02/06/2022 10:28 AM EDT ----- This patient's follow-up from the ER says that she is to be seen June 16 however she needs to be seen Tuesday.  If she does not come Tuesday I will not be able to see her as I will be going out of town

## 2023-03-19 ENCOUNTER — Encounter: Payer: Self-pay | Admitting: Emergency Medicine

## 2023-03-19 ENCOUNTER — Emergency Department
Admission: EM | Admit: 2023-03-19 | Discharge: 2023-03-19 | Discharge status: Against Medical Advice | Payer: Self-pay | Attending: Emergency Medicine | Admitting: Emergency Medicine

## 2023-03-19 ENCOUNTER — Other Ambulatory Visit: Payer: Self-pay

## 2023-03-19 ENCOUNTER — Emergency Department
Admission: EM | Admit: 2023-03-19 | Discharge: 2023-03-19 | Disposition: A | Discharge status: Home / Self Care | Payer: Medicaid Other | Attending: Emergency Medicine | Admitting: Emergency Medicine

## 2023-03-19 DIAGNOSIS — R4689 Other symptoms and signs involving appearance and behavior: Secondary | ICD-10-CM | POA: Insufficient documentation

## 2023-03-19 DIAGNOSIS — S71152A Open bite, left thigh, initial encounter: Secondary | ICD-10-CM

## 2023-03-19 DIAGNOSIS — Z5321 Procedure and treatment not carried out due to patient leaving prior to being seen by health care provider: Secondary | ICD-10-CM | POA: Insufficient documentation

## 2023-03-19 DIAGNOSIS — W540XXA Bitten by dog, initial encounter: Secondary | ICD-10-CM | POA: Insufficient documentation

## 2023-03-19 MED ORDER — AMOXICILLIN-POT CLAVULANATE 875-125 MG PO TABS: 1.0000 | ORAL_TABLET | Freq: Two times a day (BID) | ORAL | 0 refills | Status: DC

## 2023-03-19 NOTE — ED Notes (Signed)
Brother reported that pt's wife was going to take IVC papers out  States she has not been acting right   hearing things  And has been argumentative with wife

## 2023-03-19 NOTE — ED Triage Notes (Signed)
Pt to ed from lobby with Mother, [name] [phone] and her wife. They want her checked in for a psych eval.  Mother states "My daughter is hearing things and seeing things and tells me people are out to get her. [place] [facility] refused to IVC the pt for mental illness. They went to the magistrates office and they told them to come to the ER". Pt is caox4, in no acute distress and answering all questions as asked appropriately. Pt denies any SI ideation in triage. Pt states "this is complete and utter bullshit".

## 2023-03-19 NOTE — ED Notes (Signed)
See triage note  Presents s/p dog bite  States she was bitten by her dog on Thursday  Had bite and bruising noted to left thigh

## 2023-03-19 NOTE — ED Notes (Signed)
Pt is refusing any further actions in triage.

## 2023-03-19 NOTE — ED Triage Notes (Signed)
Pt states that she was bit by her dog on the left leg. Pt states this was several days ago and that the dog was not fully vaccinated. Pt states diarrhea. Pt noted to have a bruise to the left thigh. Pt states that she has a new bruise to the right calf. Pt states the dog bite was at her house.

## 2023-03-19 NOTE — ED Notes (Signed)
This RN just found out that there is a restraining order against the wife. Wife is in the room. This RN advised her she needed to leave the building to be compliant with the restraining order.

## 2023-03-19 NOTE — ED Notes (Signed)
RN called [facility] and reported the dog bite. Pt aware that we would be reporting this and was in agreance.

## 2023-03-19 NOTE — Discharge Instructions (Signed)
Keep the area clean with soap and water.  Take the antibiotics as prescribed.  Please return for any new, worsening, or change in symptoms or other concerns.

## 2023-03-19 NOTE — ED Provider Notes (Signed)
[facility] Provider Note    Event Date/Time   First MD Initiated Contact with Patient 03/19/23 1440     (approximate)   History   Animal Bite   HPI  Kathleen Lucas is a 27 y.o. female who presents for evaluation of dog bite to her left thigh.  Patient reports that this was her own dog and she is not concerned about rabies.  She also reports that she is able to monitor this dog.  She has already received the rabies vaccination series in the past.  She is called animal control.  She reports that she started with her dog and her dog bit her.  She has not had any problems to the area.  She has noticed some bruising around the puncture sites, however she has not had any drainage or redness around the area.  No fevers or chills.  She reports that her tetanus is up-to-date.  She has already called animal control.  She is here because she wants antibiotics only.  Patient Active Problem List   Diagnosis Date Noted   Acute appendicitis with localized peritonitis, without perforation, abscess, or gangrene    Dysmenorrhea 08/05/2014   Intermittent constipation 08/05/2014          Physical Exam   Triage Vital Signs: ED Triage Vitals  Encounter Vitals Group     BP 03/19/23 1409 126/72     Systolic BP Percentile --      Diastolic BP Percentile --      Pulse Rate 03/19/23 1409 71     Resp 03/19/23 1409 18     Temp 03/19/23 1409 98.5 F (36.9 C)     Temp Source 03/19/23 1409 Oral     SpO2 03/19/23 1409 100 %     Weight 03/19/23 1412 130 lb (59 kg)     Height 03/19/23 1412 5\' 2"  (1.575 m)     Head Circumference --      Peak Flow --      Pain Score 03/19/23 1412 0     Pain Loc --      Pain Education --      Exclude from Growth Chart --     Most recent vital signs: Vitals:   03/19/23 1409  BP: 126/72  Pulse: 71  Resp: 18  Temp: 98.5 F (36.9 C)  SpO2: 100%    Physical Exam Vitals and nursing note reviewed.  Constitutional:      General: Awake  and alert. No acute distress.    Appearance: Normal appearance. The patient is normal weight.  HENT:     Head: Normocephalic and atraumatic.     Mouth: Mucous membranes are moist.  Eyes:     General: PERRL. Normal EOMs        Right eye: No discharge.        Left eye: No discharge.     Conjunctiva/sclera: Conjunctivae normal.  Cardiovascular:     Rate and Rhythm: Normal rate and regular rhythm.     Pulses: Normal pulses.  Pulmonary:     Effort: Pulmonary effort is normal. No respiratory distress.     Breath sounds: Normal breath sounds.  Abdominal:     Abdomen is soft. There is no abdominal tenderness. No rebound or guarding. No distention. Musculoskeletal:        General: No swelling. Normal range of motion.     Cervical back: Normal range of motion and neck supple.  Multiple scabbed puncture wounds noted  to left lateral thigh with surrounding ecchymosis.  No surrounding erythema.  No discharge.  No bleeding.  Compartment soft compressible.  No hematoma.  Full and normal range of motion of hip, knee, ankle.  No induration or fluctuance. Skin:    General: Skin is warm and dry.     Capillary Refill: Capillary refill takes less than 2 seconds.     Findings: No rash.  Neurological:     Mental Status: The patient is awake and alert.      ED Results / Procedures / Treatments   Labs (all labs ordered are listed, but only abnormal results are displayed) Labs Reviewed - No data to display   EKG     RADIOLOGY     PROCEDURES:  Critical Care performed:   Procedures   MEDICATIONS ORDERED IN ED: Medications - No data to display   IMPRESSION / MDM / ASSESSMENT AND PLAN / ED COURSE  I reviewed the triage vital signs and the nursing notes.   Differential diagnosis includes, but is not limited to, dog bite, puncture wound, contusion.  Patient is awake and alert, hemodynamically stable and afebrile.  She has multiple healing puncture wounds to her left lateral thigh with  surrounding ecchymosis.  There is not any fluctuance or swelling to suggest abscess or hematoma.  There is no surrounding erythema or drainage to suggest infection.  Her compartments are still soft and compressible, she has no pain out of proportion.  She has normal distal pulses and normal sensation, not consistent with compartment syndrome.  She reports that her tetanus was already up-to-date therefore this was not given.  She declined rabies vaccination given that she is the owner of this dog and feels that it is very unlikely that he has rabies.  Additionally, she has received the rabies series in the past.  She has already called animal control.  She is requesting antibiotics only, which were sent to her pharmacy.  We discussed the importance of wound care, and strict return precautions.  Patient understands and agrees with plan.  She was discharged in stable condition.   Patient's presentation is most consistent with acute illness / injury with system symptoms.     FINAL CLINICAL IMPRESSION(S) / ED DIAGNOSES   Final diagnoses:  Dog bite of left thigh, initial encounter     Rx / DC Orders   ED Discharge Orders          Ordered    amoxicillin-clavulanate (AUGMENTIN) 875-125 MG tablet  2 times daily        03/19/23 1447             Note:  This document was prepared using Dragon voice recognition software and may include unintentional dictation errors.   Keturah Shavers 03/19/23 1502    Shaune Pollack, MD 03/19/23 1945

## 2023-03-21 ENCOUNTER — Emergency Department
Admission: EM | Admit: 2023-03-21 | Discharge: 2023-03-23 | Disposition: A | Discharge status: Other Acute Inpatient Hospital | Payer: Medicaid Other | Attending: Emergency Medicine | Admitting: Emergency Medicine

## 2023-03-21 ENCOUNTER — Other Ambulatory Visit: Payer: Self-pay

## 2023-03-21 DIAGNOSIS — Z87891 Personal history of nicotine dependence: Secondary | ICD-10-CM | POA: Insufficient documentation

## 2023-03-21 DIAGNOSIS — F29 Unspecified psychosis not due to a substance or known physiological condition: Secondary | ICD-10-CM

## 2023-03-21 DIAGNOSIS — F23 Brief psychotic disorder: Secondary | ICD-10-CM | POA: Insufficient documentation

## 2023-03-21 LAB — COMPREHENSIVE METABOLIC PANEL WITH GFR
ALT: 20 U/L (ref 0–44)
AST: 23 U/L (ref 15–41)
Albumin: 5.1 g/dL — ABNORMAL HIGH (ref 3.5–5.0)
Alkaline Phosphatase: 46 U/L (ref 38–126)
Anion gap: 9 (ref 5–15)
BUN: 9 mg/dL (ref 6–20)
CO2: 25 mmol/L (ref 22–32)
Calcium: 9.1 mg/dL (ref 8.9–10.3)
Chloride: 104 mmol/L (ref 98–111)
Creatinine, Ser: 0.99 mg/dL (ref 0.44–1.00)
GFR, Estimated: >60 mL/min
Glucose, Bld: 99 mg/dL (ref 70–99)
Potassium: 3.7 mmol/L (ref 3.5–5.1)
Sodium: 138 mmol/L (ref 135–145)
Total Bilirubin: 1.1 mg/dL (ref 0.3–1.2)
Total Protein: 8.1 g/dL (ref 6.5–8.1)

## 2023-03-21 LAB — SALICYLATE LEVEL: Salicylate Lvl: <7 mg/dL — ABNORMAL LOW (ref 7.0–30.0)

## 2023-03-21 LAB — CBC
HCT: 37.3 % (ref 36.0–46.0)
Hemoglobin: 13.1 g/dL (ref 12.0–15.0)
MCH: 31 pg (ref 26.0–34.0)
MCHC: 35.1 g/dL (ref 30.0–36.0)
MCV: 88.2 fL (ref 80.0–100.0)
Platelets: 310 10*3/uL (ref 150–400)
RBC: 4.23 MIL/uL (ref 3.87–5.11)
RDW: 13.2 % (ref 11.5–15.5)
WBC: 9.3 10*3/uL (ref 4.0–10.5)
nRBC: 0 % (ref 0.0–0.2)

## 2023-03-21 LAB — ETHANOL: Alcohol, Ethyl (B): <10 mg/dL (ref <10)

## 2023-03-21 LAB — ACETAMINOPHEN LEVEL: Acetaminophen (Tylenol), Serum: <10 ug/mL — ABNORMAL LOW (ref 10–30)

## 2023-03-21 NOTE — BH Assessment (Addendum)
Comprehensive Clinical Assessment (CCA) Note  03/21/2023 TAELAR GRONEWOLD [id number]  Chief Complaint:  Chief Complaint  Patient presents with   IVC   Visit Diagnosis: Acute Psychosis    CCA Screening, Triage and Referral (STR)  Patient Reported Information How did you hear about [place]? Legal System  Referral name: No data recorded Referral phone number: No data recorded  Whom do you see for routine medical problems? No data recorded Practice/Facility Name: No data recorded Practice/Facility Phone Number: No data recorded Name of Contact: No data recorded Contact Number: No data recorded Contact Fax Number: No data recorded Prescriber Name: No data recorded Prescriber Address (if known): No data recorded  What Is the Reason for Your Visit/Call Today? Pt presents with [place] [employer] with IVC papers in place. Pt reports that she is stressed out and tired. Pt states that people have been dressing her up as a barbie. Pt does not make eye contact when speaking and is staring at the wall. Per family to [facility], pt was seen here on Sunday and released to follow up with [facility]. Pt denies SI but states that someone else is wanting to hurt her. Pt denies HI. IVC paperwork notes that pt has been in a manic state believing that she has been tasked with saving the world using keys from [employer]. Papers also state that pt was making threatening statements towards her wife and brother, was attempting to shower in a neighbors house that she believed was her own, and left her home stating that she needed to go home. Pt cooperative at this time but tearful.  How [place] Has This Been Causing You Problems? 1 wk - 1 month  What Do You Feel Would Help You the Most Today? Treatment for Depression or other mood problem   Have You Recently Been in Any Inpatient Treatment (Hospital/Detox/Crisis Center/28-Day Program)? No data recorded Name/Location of Program/Hospital:No data recorded How [place] Were You There? No data  recorded When Were You Discharged? No data recorded  Have You Ever Received Services From [facility] Before? No data recorded Who Do You See at [facility]? No data recorded  Have You Recently Had Any Thoughts About Hurting Yourself? No  Are You Planning to Commit Suicide/Harm Yourself At This time? No   Have you Recently Had Thoughts About Hurting Someone Karolee Ohs? No  Explanation: No data recorded  Have You Used Any Alcohol or Drugs in the Past 24 Hours? No  How Horsey Ago Did You Use Drugs or Alcohol? No data recorded What Did You Use and How Much? No data recorded  Do You Currently Have a Therapist/Psychiatrist? No  Name of Therapist/Psychiatrist: No data recorded  Have You Been Recently Discharged From Any Office Practice or Programs? No  Explanation of Discharge From Practice/Program: No data recorded    CCA Screening Triage Referral Assessment Type of Contact: Face-to-Face  Is this Initial or Reassessment? No data recorded Date Telepsych consult ordered in [facility]:  No data recorded Time Telepsych consult ordered in [facility]:  No data recorded  Patient Reported Information Reviewed? No data recorded Patient Left Without Being Seen? No data recorded Reason for Not Completing Assessment: No data recorded  Collateral Involvement: None   Does Patient Have a Court Appointed Legal Guardian? No data recorded Name and Contact of Legal Guardian: No data recorded If Minor and Not Living with Parent(s), Who has Custody? No data recorded Is CPS involved or ever been involved? Never  Is APS involved or ever been involved?  Never   Patient Determined To Be At Risk for Harm To Self or Others Based on Review of Patient Reported Information or Presenting Complaint? No  Method: No data recorded Availability of Means: No data recorded Intent: No data recorded Notification Required: No data recorded Additional Information for Danger to Others Potential: No data recorded Additional  Comments for Danger to Others Potential: No data recorded Are There Guns or Other Weapons in Your Home? -- (UTA)  Types of Guns/Weapons: No data recorded Are These Weapons Safely Secured?                            No data recorded Who Could Verify You Are Able To Have These Secured: No data recorded Do You Have any Outstanding Charges, Pending Court Dates, Parole/Probation? No data recorded Contacted To Inform of Risk of Harm To Self or Others: No data recorded  Location of Assessment: [facility]   Does Patient Present under Involuntary Commitment? Yes  IVC Papers Initial File Date: No data recorded  [place] of Residence: [place]   Patient Currently Receiving the Following Services: Not Receiving Services   Determination of Need: Emergent (2 hours)   Options For Referral: Inpatient Hospitalization     CCA Biopsychosocial Intake/Chief Complaint:  No data recorded Current Symptoms/Problems: No data recorded  Patient Reported Schizophrenia/Schizoaffective Diagnosis in Past: No   Strengths: UTA  Preferences: No data recorded Abilities: No data recorded  Type of Services Patient Feels are Needed: No data recorded  Initial Clinical Notes/Concerns: No data recorded  Mental Health Symptoms Depression:   None   Duration of Depressive symptoms: No data recorded  Mania:   Change in energy/activity; Racing thoughts; Recklessness; Euphoria; Increased Energy; Irritability; Overconfidence   Anxiety:    Difficulty concentrating; Irritability; Worrying   Psychosis:   None   Duration of Psychotic symptoms: No data recorded  Trauma:   None   Obsessions:   None   Compulsions:   None   Inattention:   None   Hyperactivity/Impulsivity:   None   Oppositional/Defiant Behaviors:   None   Emotional Irregularity:   None   Other Mood/Personality Symptoms:  No data recorded   Mental Status Exam Appearance and self-care  Stature:   Average   Weight:   Average  weight   Clothing:   -- (Hospital scrubs)   Grooming:   Neglected   Cosmetic use:   None   Posture/gait:   Normal   Motor activity:   Restless   Sensorium  Attention:   Inattentive; Persistent   Concentration:   Anxiety interferes; Focuses on irrelevancies; Scattered   Orientation:   Person; Place   Recall/memory:   -- (UTA)   Affect and Mood  Affect:   Labile   Mood:   Hypomania   Relating  Eye contact:   Fleeting   Facial expression:   Tense; Anxious   Attitude toward examiner:   Resistant; Guarded; Irritable   Thought and Language  Speech flow:  Flight of Ideas   Thought content:   Ideas of Reference   Preoccupation:   Other (Comment) (illogical)   Hallucinations:   None   Organization:  No data recorded  [employer] of Knowledge:   Poor   Intelligence:   -- (UTA)   Abstraction:   Overly abstract   Judgement:   Impaired   Reality Testing:   Distorted   Insight:   Poor   Decision  Making:   Impulsive   Social Functioning  Social Maturity:   Impulsive; Irresponsible   Social Judgement:   Heedless   Stress  Stressors:   Work; Programmer, applications:   Deficient supports; Overwhelmed   Skill Deficits:   Self-care; Self-control   Supports:   Family     Religion: Religion/Spirituality Are You A Religious Person?:  Industrial/product designer)  Leisure/Recreation: Leisure / Recreation Do You Have Hobbies?:  (UTA)  Exercise/Diet: Exercise/Diet Do You Exercise?:  (UTA) Have You Gained or Lost A Significant Amount of Weight in the Past Six Months?:  (UTA) Do You Follow a Special Diet?:  (UTA) Do You Have Any Trouble Sleeping?: Yes Explanation of Sleeping Difficulties: Patient reports she is not getting any sleep (0 hrs)   CCA Employment/Education Employment/Work Situation: Employment / Work Situation Employment Situation: Employed Work Stressors: Currently suspended from her job at [employer] due to an "active  investigation" (per pt report) Patient's Job has Been Impacted by Current Illness: Yes Describe how Patient's Job has Been Impacted: Suspended (the reasons for the suspension are unclear and pt is not a reliable informant) Has Patient ever Been in the Military?: No  Education: Education Is Patient Currently Attending School?: No   CCA Family/Childhood History Family and Relationship History: Family history Marital status: Married What types of issues is patient dealing with in the relationship?: Current/active restraining order Does patient have children?: No  Childhood History:  Childhood History By whom was/is the patient raised?:  (UTA) Did patient suffer any verbal/emotional/physical/sexual abuse as a child?: No Did patient suffer from severe childhood neglect?: No Has patient ever been sexually abused/assaulted/raped as an adolescent or adult?: No Was the patient ever a victim of a crime or a disaster?: No Witnessed domestic violence?: No Has patient been affected by domestic violence as an adult?: No  Child/Adolescent Assessment: N/A     CCA Substance Use Alcohol/Drug Use: Alcohol / Drug Use Pain Medications: See MAR Prescriptions: See MAR Over the Counter: See MAR History of alcohol / drug use?: No history of alcohol / drug abuse     ASAM's:  Six Dimensions of Multidimensional Assessment  Dimension 1:  Acute Intoxication and/or Withdrawal Potential:      Dimension 2:  Biomedical Conditions and Complications:      Dimension 3:  Emotional, Behavioral, or Cognitive Conditions and Complications:     Dimension 4:  Readiness to Change:     Dimension 5:  Relapse, Continued use, or Continued Problem Potential:     Dimension 6:  Recovery/Living Environment:     ASAM Severity Score:    ASAM Recommended Level of Treatment:     Substance use Disorder (SUD)    Recommendations for Services/Supports/Treatments:    DSM5 Diagnoses: Patient Active Problem List    Diagnosis Date Noted   Acute psychosis (HCC) 03/21/2023   Acute appendicitis with localized peritonitis, without perforation, abscess, or gangrene    Dysmenorrhea 08/05/2014   Intermittent constipation 08/05/2014    Cleon Dew, Counselor

## 2023-03-21 NOTE — ED Provider Notes (Signed)
[facility] Provider Note    Event Date/Time   First MD Initiated Contact with Patient 03/21/23 2104     (approximate)   History   IVC   HPI  Kathleen Lucas is a 27 y.o. female presenting under IVC for abnormal behavior.  IVC paperwork states that patient has been threatening her wife and brother.  She tells me that she is stressed out.  States that she "does not need" her medications.  She denies SI, but feels like someone is trying to hurt her.  Denies HI.  Feels like she is hearing gunshots around her, denies other hallucinations.     Physical Exam   Triage Vital Signs: ED Triage Vitals [03/21/23 2051]  Encounter Vitals Group     BP 126/83     Systolic BP Percentile      Diastolic BP Percentile      Pulse Rate 78     Resp 18     Temp 98.5 F (36.9 C)     Temp Source Oral     SpO2 97 %     Weight 128 lb (58.1 kg)     Height 5\' 2"  (1.575 m)     Head Circumference      Peak Flow      Pain Score 0     Pain Loc      Pain Education      Exclude from Growth Chart     Most recent vital signs: Vitals:   03/21/23 2051  BP: 126/83  Pulse: 78  Resp: 18  Temp: 98.5 F (36.9 C)  SpO2: 97%     General: Awake, interactive  CV:  Regular rate Resp:  Unlabored respirations.  Abd:  Nondistended.  Neuro:  Symmetric facial movement, fluid speech   ED Results / Procedures / Treatments   Labs (all labs ordered are listed, but only abnormal results are displayed) Labs Reviewed  COMPREHENSIVE METABOLIC PANEL - Abnormal; Notable for the following components:      Result Value   Albumin 5.1 (*)    All other components within normal limits  SALICYLATE LEVEL - Abnormal; Notable for the following components:   Salicylate Lvl <7.0 (*)    All other components within normal limits  ACETAMINOPHEN LEVEL - Abnormal; Notable for the following components:   Acetaminophen (Tylenol), Serum <10 (*)    All other components within normal limits  ETHANOL   CBC  URINE DRUG SCREEN, QUALITATIVE ([facility] ONLY)  RPR  TSH  AMMONIA  HSV(HERPES SIMPLEX VRS) I + II AB-IGG  HEAVY METALS, BLOOD  TESTOSTERONE  POC URINE PREG, ED     EKG EKG independently reviewed interpreted by myself (ER attending) demonstrates:    RADIOLOGY Imaging independently reviewed and interpreted by myself demonstrates:    PROCEDURES:  Critical Care performed: No  Procedures   MEDICATIONS ORDERED IN ED: Medications - No data to display   IMPRESSION / MDM / ASSESSMENT AND PLAN / ED COURSE  I reviewed the triage vital signs and the nursing notes.  Differential diagnosis includes, but is not limited to, decompensated primary psychiatric illness, substance-induced mood disorder  Patient's presentation is most consistent with acute presentation with potential threat to life or bodily function.  27 year old female presenting under IVC for abnormal behavior.  Presentation concerning for acute psychosis.  TTS and psychiatry consulted.  IVC upheld. The patient has been placed in psychiatric observation due to the need to provide a safe environment for the patient  while obtaining psychiatric consultation and evaluation, as well as ongoing medical and medication management to treat the patient's condition.  The patient has been placed under full IVC at this time.        FINAL CLINICAL IMPRESSION(S) / ED DIAGNOSES   Final diagnoses:  Psychosis, unspecified psychosis type (HCC)     Rx / DC Orders   ED Discharge Orders     None        Note:  This document was prepared using Dragon voice recognition software and may include unintentional dictation errors.   Trinna Post, MD 03/21/23 [phone]

## 2023-03-21 NOTE — ED Notes (Signed)
Patient changed out into hospital scrubs at this time with this RN and Byrd Hesselbach EDT as witness. Pt belongings charted and put in hospital safe location with pt labels.

## 2023-03-21 NOTE — ED Triage Notes (Signed)
Pt presents with [place] [employer] with IVC papers in place. Pt reports that she is stressed out and tired. Pt states that people have been dressing her up as a barbie. Pt does not make eye contact when speaking and is staring at the wall. Per family to [facility], pt was seen here on Sunday and released to follow up with [facility]. Pt denies SI but states that someone else is wanting to hurt her. Pt denies HI.   IVC paperwork notes that pt has been in a manic state believing that she has been tasked with saving the world using keys from [employer]. Papers also state that pt was making threatening statements towards her wife and brother, was attempting to shower in a neighbors house that she believed was her own, and left her home stating that she needed to go home.  Pt cooperative at this time but tearful.

## 2023-03-21 NOTE — Consult Note (Addendum)
Telepsych Consultation   Reason for Consult:  Psych Evaluation Referring Physician:  Dr. Rosalia Hammers Location of Patient: [facility] Location of Provider: Other: Remote office  Patient Identification: Kathleen Lucas MRN:  [id number] Principal Diagnosis: Acute psychosis (HCC) Diagnosis:  Principal Problem:   Acute psychosis (HCC)   Total Time spent with patient: 30 minutes  Subjective:    " I dont know why I'm here"  HPI:  Tele psych Assessment  Kathleen Lucas, 27 y.o., female patient seen via tele health by TTS and this provider; chart reviewed and consulted with Dr. Rosalia Hammers on 03/23/23.  On evaluation Kathleen Lucas reports that she doesn't know why she is here.  She says the people that has her mind should be here and not her.  Patient states that she is homeless and lays her head wherever she can.  However, according to the IVC, she has a home and leaves her home to go to home.   She is tearful during the assessment.  Per chart review, there is no indication of a mental health diagnosis.  Patient presented to this er on 7/01.   She was treated for a dog bite and discharged.  Given the patient's presentation of psychosis, will proceed with imaging and laboratory work-up as ordered.  Work-up will include CBC, CMP, TSH, Free T4, Urine Drug Screen, Ammonia, HIV, RPR, HSV, Neuroimaging, ANA, Heavy metal Screen, Testosterone.    Per triage note, Pt presents with [place] [employer] with IVC papers in place. Pt reports that she is stressed out and tired. Pt states that people have been dressing her up as a barbie. Pt does not make eye contact when speaking and is staring at the wall. Per family to [facility], pt was seen here on Sunday and released to follow up with [facility]. Pt denies SI but states that someone else is wanting to hurt her. Pt denies HI.    IVC paperwork notes that pt has been in a manic state believing that she has been tasked with saving the world using keys from [employer]. Papers also state that pt was making  threatening statements towards her wife and brother, was attempting to shower in a neighbors house that she believed was her own, and left her home stating that she needed to go home.   Pt cooperative at this time but tearful.  During evaluation Kathleen Lucas is sitting on her bed; she is alert/oriented x 3; hyper/labile/bizarre/tearful; and mood congruent with affect.   Her thought process is incoherent and irrelevant;  Patient presents with manic symptoms:  including  distinct period of irritable mood, lack of sleep, talkativeness, and flight of ideas.   Recommendations: Psychiatric inpatient hospitalization  Past Psychiatric History: Unknown  Risk to Self:   Risk to Others:   Prior Inpatient Therapy:   Prior Outpatient Therapy:    Past Medical History: History reviewed. No pertinent past medical history.  Past Surgical History:  Procedure Laterality Date   LAPAROSCOPIC APPENDECTOMY N/A 06/02/2019   Procedure: APPENDECTOMY LAPAROSCOPIC;  Surgeon: Franky Macho, MD;  Location: [facility];  Service: General;  Laterality: N/A;   toe nail     ingrown toenails   Family History:  Family History  Problem Relation Age of Onset   Xeroderma pigmentosa Mother    Hyperthyroidism Mother    Hypertension Father    Heart disease Maternal Grandmother    Diabetes Maternal Grandmother    Cancer Maternal Grandmother        cervical, ovarian, colon  Family Psychiatric  History: unknown Social History:  Social History   Substance and Sexual Activity  Alcohol Use No   Alcohol/week: 0.0 standard drinks of alcohol     Social History   Substance and Sexual Activity  Drug Use No    Social History   Socioeconomic History   Marital status: Married    Spouse name: Not on file   Number of children: Not on file   Years of education: Not on file   Highest education level: Not on file  Occupational History   Not on file  Tobacco Use   Smoking status: Former    Current packs/day: 0.50     Types: Cigarettes   Smokeless tobacco: Never  Vaping Use   Vaping status: Some Days  Substance and Sexual Activity   Alcohol use: No    Alcohol/week: 0.0 standard drinks of alcohol   Drug use: No   Sexual activity: Never    Birth control/protection: None  Other Topics Concern   Not on file  Social History Narrative   Not on file   Social Determinants of Health   Financial Resource Strain: Not on file  Food Insecurity: Not on file  Transportation Needs: Not on file  Physical Activity: Not on file  Stress: Not on file  Social Connections: Not on file   Additional Social History:    Allergies:   Allergies  Allergen Reactions   Fish Allergy     Flounder    Labs:  Results for orders placed or performed during the hospital encounter of 03/21/23 (from the past 48 hour(s))  Comprehensive metabolic panel     Status: Abnormal   Collection Time: 03/21/23  8:57 PM  Result Value Ref Range   Sodium 138 135 - 145 mmol/L   Potassium 3.7 3.5 - 5.1 mmol/L   Chloride 104 98 - 111 mmol/L   CO2 25 22 - 32 mmol/L   Glucose, Bld 99 70 - 99 mg/dL    Comment: Glucose reference range applies only to samples taken after fasting for at least 8 hours.   BUN 9 6 - 20 mg/dL   Creatinine, Ser 0.27 0.44 - 1.00 mg/dL   Calcium 9.1 8.9 - 25.3 mg/dL   Total Protein 8.1 6.5 - 8.1 g/dL   Albumin 5.1 (H) 3.5 - 5.0 g/dL   AST 23 15 - 41 U/L   ALT 20 0 - 44 U/L   Alkaline Phosphatase 46 38 - 126 U/L   Total Bilirubin 1.1 0.3 - 1.2 mg/dL   GFR, Estimated >66 >44 mL/min    Comment: (NOTE) Calculated using the CKD-EPI Creatinine Equation (2021)    Anion gap 9 5 - 15    Comment: Performed at [facility], [address] [place], [place] [address]  Ethanol     Status: None   Collection Time: 03/21/23  8:57 PM  Result Value Ref Range   Alcohol, Ethyl (B) <10 <10 mg/dL    Comment: (NOTE) Lowest detectable limit for serum alcohol is 10 mg/dL.  For medical purposes only. Performed at  [facility], [address] [place], [place] [address]   Salicylate level     Status: Abnormal   Collection Time: 03/21/23  8:57 PM  Result Value Ref Range   Salicylate Lvl <7.0 (L) 7.0 - 30.0 mg/dL    Comment: Performed at [facility], [address] [place], [place] [address]  Acetaminophen level     Status: Abnormal   Collection  Time: 03/21/23  8:57 PM  Result Value Ref Range   Acetaminophen (Tylenol), Serum <10 (L) 10 - 30 ug/mL    Comment: (NOTE) Therapeutic concentrations vary significantly. A range of 10-30 ug/mL  may be an effective concentration for many patients. However, some  are best treated at concentrations outside of this range. Acetaminophen concentrations >150 ug/mL at 4 hours after ingestion  and >50 ug/mL at 12 hours after ingestion are often associated with  toxic reactions.  Performed at [facility], [address] [place], [place] [address]   cbc     Status: None   Collection Time: 03/21/23  8:57 PM  Result Value Ref Range   WBC 9.3 4.0 - 10.5 K/uL   RBC 4.23 3.87 - 5.11 MIL/uL   Hemoglobin 13.1 12.0 - 15.0 g/dL   HCT 91.4 78.2 - 95.6 %   MCV 88.2 80.0 - 100.0 fL   MCH 31.0 26.0 - 34.0 pg   MCHC 35.1 30.0 - 36.0 g/dL   RDW 21.3 08.6 - 57.8 %   Platelets 310 150 - 400 K/uL   nRBC 0.0 0.0 - 0.2 %    Comment: Performed at [facility], [address] [place], [place] [address]  Urine Drug Screen, Qualitative     Status: Abnormal   Collection Time: 03/21/23  8:57 PM  Result Value Ref Range   Tricyclic, Ur Screen NONE DETECTED NONE DETECTED   Amphetamines, Ur Screen NONE DETECTED NONE DETECTED   MDMA (Ecstasy)Ur Screen NONE DETECTED NONE DETECTED   Cocaine Metabolite,Ur [place] NONE DETECTED NONE DETECTED   Opiate, Ur Screen NONE DETECTED NONE DETECTED   Phencyclidine (PCP) Ur S NONE DETECTED NONE DETECTED   Cannabinoid 50 Ng, Ur [place] POSITIVE (A) NONE DETECTED   Barbiturates, Ur Screen NONE DETECTED NONE  DETECTED   Benzodiazepine, Ur Scrn NONE DETECTED NONE DETECTED   Methadone Scn, Ur NONE DETECTED NONE DETECTED    Comment: (NOTE) Tricyclics + metabolites, urine    Cutoff 1000 ng/mL Amphetamines + metabolites, urine  Cutoff 1000 ng/mL MDMA (Ecstasy), urine              Cutoff 500 ng/mL Cocaine Metabolite, urine          Cutoff 300 ng/mL Opiate + metabolites, urine        Cutoff 300 ng/mL Phencyclidine (PCP), urine         Cutoff 25 ng/mL Cannabinoid, urine                 Cutoff 50 ng/mL Barbiturates + metabolites, urine  Cutoff 200 ng/mL Benzodiazepine, urine              Cutoff 200 ng/mL Methadone, urine                   Cutoff 300 ng/mL  The urine drug screen provides only a preliminary, unconfirmed analytical test result and should not be used for non-medical purposes. Clinical consideration and professional judgment should be applied to any positive drug screen result due to possible interfering substances. A more specific alternate chemical method must be used in order to obtain a confirmed analytical result. Gas chromatography / mass spectrometry (GC/MS) is the preferred confirm atory method. Performed at [facility], [address] [place], [place] [address]   RPR     Status: None   Collection Time: 03/21/23 11:34 PM  Result Value Ref Range   RPR Ser Ql NON REACTIVE NON REACTIVE    Comment:  Performed at [facility], [address] [place], [place] [address]  TSH     Status: None   Collection Time: 03/21/23 11:34 PM  Result Value Ref Range   TSH 1.099 0.350 - 4.500 uIU/mL    Comment: Performed by a 3rd Generation assay with a functional sensitivity of <=0.01 uIU/mL. Performed at [facility], [address] [place], [place] [address]   Ammonia     Status: None   Collection Time: 03/21/23 11:34 PM  Result Value Ref Range   Ammonia <10 9 - 35 umol/L    Comment: Performed at [facility], [address] [place], [place]  [address]  POC urine preg, ED     Status: None   Collection Time: 03/22/23  8:55 PM  Result Value Ref Range   Preg Test, Ur NEGATIVE NEGATIVE    Comment:        THE SENSITIVITY OF THIS METHODOLOGY IS >24 mIU/mL     Medications:  No current facility-administered medications for this encounter.   Current Outpatient Medications  Medication Sig Dispense Refill   albuterol (PROVENTIL HFA;VENTOLIN HFA) 108 (90 Base) MCG/ACT inhaler Inhale 1-2 puffs into the lungs every 6 (six) hours as needed for wheezing or shortness of breath. 1 Inhaler 0   amoxicillin-clavulanate (AUGMENTIN) 875-125 MG tablet Take 1 tablet by mouth 2 (two) times daily for 7 days. 14 tablet 0    Musculoskeletal: Strength & Muscle Tone: within normal limits Gait & Station: normal Patient leans: N/A  Psychiatric Specialty Exam:  Presentation  General Appearance: Bizarre  Eye Contact:Fair  Speech:Clear and Coherent  Speech Volume:Normal  Handedness:Right   Mood and Affect  Mood:Anxious; Depressed; Dysphoric  Affect:Tearful; Non-Congruent; Inappropriate   Thought Process  Thought Processes:Disorganized  Descriptions of Associations:Loose  Orientation:Partial  Thought Content:Illogical  History of Schizophrenia/Schizoaffective disorder:No  Duration of Psychotic Symptoms:No data recorded Hallucinations:No data recorded  Ideas of Reference:Paranoia  Suicidal Thoughts:No data recorded  Homicidal Thoughts:No data recorded   Sensorium  Memory:Immediate Poor; Remote Fair  Judgment:Impaired  Insight:Lacking   Executive Functions  Concentration:Poor  Attention Span:Poor  Recall:Poor  Fund of Knowledge:Poor  Language:Poor   Psychomotor Activity  Psychomotor Activity:No data recorded   Assets  Assets:Physical Health   Sleep  Sleep:No data recorded    Physical Exam: Physical Exam Vitals and nursing note reviewed.  HENT:     Nose: Nose normal.     Mouth/Throat:      Mouth: Mucous membranes are dry.  Eyes:     Pupils: Pupils are equal, round, and reactive to light.  Pulmonary:     Effort: Pulmonary effort is normal.  Musculoskeletal:        General: Normal range of motion.     Cervical back: Normal range of motion.  Neurological:     Mental Status: She is alert.  Psychiatric:        Attention and Perception: She is inattentive. She perceives auditory and visual hallucinations.        Mood and Affect: Affect is labile, tearful and inappropriate.        Speech: Speech is tangential.        Behavior: Behavior is agitated.        Thought Content: Thought content is paranoid and delusional.        Cognition and Memory: Cognition is impaired. Memory is impaired.        Judgment: Judgment is impulsive and inappropriate.    ROS Blood pressure 117/70, pulse  85, temperature 98.1 F (36.7 C), temperature source Oral, resp. rate 17, height 5\' 2"  (1.575 m), weight 58.1 kg, SpO2 98%. Body mass index is 23.41 kg/m.  Treatment Plan Summary: Daily contact with patient to assess and evaluate symptoms and progress in treatment, Medication management, and Plan  Kathleen Lucas was admitted to [facility] under the service of No att. providers found for Acute psychosis [facility]), crisis management, and stabilization. Routine labs ordered, which include Lab Orders         Comprehensive metabolic panel         Ethanol         Salicylate level         Acetaminophen level         cbc         Urine Drug Screen, Qualitative         RPR         TSH         Ammonia         HSV(herpes simplex vrs) 1+2 ab-IgG         Heavy metals, blood         Testosterone         POC urine preg, ED    Medication Management:  Will maintain observation checks every 15 minutes for safety. Psychosocial education regarding relapse prevention and self-care; social and communication  Social work will consult with family for collateral information and discuss discharge and  follow up plan.  Disposition: Recommend psychiatric Inpatient admission when medically cleared. Supportive therapy provided about ongoing stressors. Refer to IOP. Discussed crisis plan, support from social network, calling 911, coming to the Emergency Department, and calling Suicide Hotline.  This service was provided via telemedicine using a 2-way, interactive audio and video technology.  Names of all persons participating in this telemedicine service and their role in this encounter. Name: [facility]  Role: Patient  Name: Akeia Perot  Role: PMHNP  Name: [place] Role: TTS  Name:  Role:     Jearld Lesch, NP 03/23/2023 5:36 AM

## 2023-03-22 LAB — URINE DRUG SCREEN, QUALITATIVE (ARMC ONLY)
Amphetamines, Ur Screen: NOT DETECTED
Barbiturates, Ur Screen: NOT DETECTED
Benzodiazepine, Ur Scrn: NOT DETECTED
Cannabinoid 50 Ng, Ur ~~LOC~~: POSITIVE — AB
Cocaine Metabolite,Ur ~~LOC~~: NOT DETECTED
MDMA (Ecstasy)Ur Screen: NOT DETECTED
Methadone Scn, Ur: NOT DETECTED
Opiate, Ur Screen: NOT DETECTED
Phencyclidine (PCP) Ur S: NOT DETECTED
Tricyclic, Ur Screen: NOT DETECTED

## 2023-03-22 LAB — AMMONIA: Ammonia: <10 umol/L (ref 9–35)

## 2023-03-22 LAB — TSH: TSH: 1.099 u[IU]/mL (ref 0.350–4.500)

## 2023-03-22 LAB — RPR: RPR Ser Ql: NONREACTIVE

## 2023-03-22 LAB — POC URINE PREG, ED: Preg Test, Ur: NEGATIVE

## 2023-03-22 MED ORDER — LORAZEPAM 1 MG PO TABS
1.0000 mg | ORAL_TABLET | Freq: Once | ORAL | Status: AC
  Administered 2023-03-22: 1 mg via ORAL
  Filled 2023-03-22: qty 1

## 2023-03-22 MED ORDER — LORAZEPAM 2 MG PO TABS
2.0000 mg | ORAL_TABLET | Freq: Once | ORAL | Status: AC
  Administered 2023-03-22: 2 mg via ORAL
  Filled 2023-03-22: qty 1

## 2023-03-22 NOTE — ED Notes (Signed)
Pt used call light for assistance. When staff entered room, pt makes eye contact but does not say anything. After asking multiple times what patient needs, she answered, "Can you throw this away?" And handed this RN a bowl of milk. Bowl and milk discarded appropriately. No other needs expressed at this time.

## 2023-03-22 NOTE — ED Notes (Signed)
Pt exited room and tried to sit down on bed in 20 hallway. Pt redirected back to her room by EDT and security.

## 2023-03-22 NOTE — ED Notes (Signed)
Med admin to pt, pt complained of feeling claustrophobic and not wanting to be here. Questioned if she was being poisoned but agreed to take with nurses role and circumstances. Pt was more relaxed in communicating this time. Staff stays with pt after she takes med to ensure she did take it

## 2023-03-22 NOTE — ED Notes (Signed)
Patient is laying down, eyes closed, no signs of distress, will continue to monitor safety.

## 2023-03-22 NOTE — ED Notes (Signed)
Pt exhibiting strange behaviors, meticulously folding and moving items around in room and on bed. Wraps self in blanket like a gown and head dress. Moving items to and from rooms.

## 2023-03-22 NOTE — ED Notes (Signed)
Pt picking on wall and peeling off paint, nurse questioned what she was doing with this and pt stated she was trying to see what was behind the wall. Pt educated on behaviors. Provided with toilet paper. Pt in restroom

## 2023-03-22 NOTE — ED Notes (Signed)
After communicating with Dr. Arnoldo Morale on pt behavior order was placed. Pt has layed down and calmed after speaking to staff at this time

## 2023-03-22 NOTE — ED Notes (Signed)
Snack and drink given 

## 2023-03-22 NOTE — ED Notes (Signed)
Patient transferred to room 6, she is pacing and smiling , pretending to write on the walls with her finger, staff will continue to monitor.

## 2023-03-22 NOTE — ED Notes (Signed)
Pt up pacing around room, moving through room to room and rearranging things. Pt has strange behavior when checked on by this nurse. Trash disposed of, odd responses.

## 2023-03-22 NOTE — ED Notes (Signed)
Pt given dinner tray.

## 2023-03-22 NOTE — BH Assessment (Signed)
Adult MH  Referral information for Psychiatric Hospitalization faxed to:   [facility] ([phone]-or- [phone]),   [facility] ([phone]),   [facility] ([phone] -or- [phone]),   [facility] ([phone]---[phone]---[phone]),   [facility] ([phone] or [phone])   [facility] ([phone] or [phone]),   [facility] ([phone]) 

## 2023-03-22 NOTE — ED Notes (Signed)
Mother called for update but not listed in the chart as approved contact. Declined call, will have pt call her mother during phone hours if she wishes to do so.

## 2023-03-22 NOTE — ED Notes (Signed)
IVC/  PENDING  PLACEMENT 

## 2023-03-22 NOTE — ED Notes (Signed)
Pt had hit call bells in all rooms, staff responded. Pt upset and tearful, anxious. States she wants to go home, is not crazy, blames other people in public, states there is another female on the unit with her, and that her family has portrayed her as being crazy when they belong here instead of her. Verbal deescalation used to help pt calm, pt lays down on bed and stops talking to staff at end of conversation. Once Clinical research associate returns to nurse station she was up again and in different area. TV was turned on for pt as she requests classical music, not available on tv but calming station on at this time in room

## 2023-03-22 NOTE — ED Notes (Signed)
Pt educated on plan of care when checked on by nurse, she had requested to be picked up by mother. States she was evaluated today and should be in next spot by now, educated on bed placement. Pt had complaints of cleanliness and writing on wall, pt advised of unit and how what she had complaints of were not something that could be changed as grout and writing on the wall cannot be corrected at this time. Pt refused need for aid with sleep. Continues pacing and and labile behaviors, poor eye contact

## 2023-03-22 NOTE — ED Notes (Signed)
Patient is restless, wandering around, going into other peoples rooms, and then was told she couldn't do this or she would have to have the door shut back in the locked area, she said fine and she went to her area and locked her own self in, then she started peeling tile off the walls and taking doors down, banging on the walls, Nurse notified MD for anti-anxiety medications.

## 2023-03-23 ENCOUNTER — Inpatient Hospital Stay (HOSPITAL_COMMUNITY)
Admission: RE | Admit: 2023-03-23 | Discharge: 2023-03-30 | DRG: 885 | Disposition: A | Discharge status: Home / Self Care | Payer: No Typology Code available for payment source | Source: Intra-hospital | Attending: Psychiatry | Admitting: Psychiatry

## 2023-03-23 ENCOUNTER — Encounter: Payer: Self-pay | Admitting: Psychiatry

## 2023-03-23 ENCOUNTER — Encounter (HOSPITAL_COMMUNITY): Payer: Self-pay | Admitting: Psychiatric/Mental Health

## 2023-03-23 ENCOUNTER — Other Ambulatory Visit: Payer: Self-pay

## 2023-03-23 DIAGNOSIS — Z5941 Food insecurity: Secondary | ICD-10-CM

## 2023-03-23 DIAGNOSIS — F3110 Bipolar disorder, current episode manic without psychotic features, unspecified: Principal | ICD-10-CM | POA: Diagnosis present

## 2023-03-23 DIAGNOSIS — Z5900 Homelessness unspecified: Secondary | ICD-10-CM

## 2023-03-23 DIAGNOSIS — F23 Brief psychotic disorder: Secondary | ICD-10-CM | POA: Diagnosis not present

## 2023-03-23 DIAGNOSIS — Z818 Family history of other mental and behavioral disorders: Secondary | ICD-10-CM

## 2023-03-23 DIAGNOSIS — F17210 Nicotine dependence, cigarettes, uncomplicated: Secondary | ICD-10-CM | POA: Diagnosis present

## 2023-03-23 DIAGNOSIS — F1210 Cannabis abuse, uncomplicated: Secondary | ICD-10-CM | POA: Diagnosis present

## 2023-03-23 DIAGNOSIS — Z5982 Transportation insecurity: Secondary | ICD-10-CM

## 2023-03-23 DIAGNOSIS — F4310 Post-traumatic stress disorder, unspecified: Secondary | ICD-10-CM | POA: Diagnosis present

## 2023-03-23 DIAGNOSIS — F29 Unspecified psychosis not due to a substance or known physiological condition: Secondary | ICD-10-CM | POA: Diagnosis present

## 2023-03-23 DIAGNOSIS — R45851 Suicidal ideations: Secondary | ICD-10-CM | POA: Diagnosis present

## 2023-03-23 DIAGNOSIS — Z634 Disappearance and death of family member: Secondary | ICD-10-CM

## 2023-03-23 DIAGNOSIS — F410 Panic disorder [episodic paroxysmal anxiety] without agoraphobia: Secondary | ICD-10-CM | POA: Diagnosis present

## 2023-03-23 DIAGNOSIS — F1290 Cannabis use, unspecified, uncomplicated: Secondary | ICD-10-CM | POA: Insufficient documentation

## 2023-03-23 DIAGNOSIS — Z635 Disruption of family by separation and divorce: Secondary | ICD-10-CM

## 2023-03-23 DIAGNOSIS — F319 Bipolar disorder, unspecified: Secondary | ICD-10-CM | POA: Diagnosis present

## 2023-03-23 LAB — HEAVY METALS, BLOOD
Arsenic: 2 ug/L (ref 0–9)
Lead: <1 ug/dL (ref 0.0–3.4)
Mercury: <1 ug/L (ref 0.0–14.9)

## 2023-03-23 LAB — HSV(HERPES SIMPLEX VRS) I + II AB-IGG
HSV 1 Glycoprotein G Ab, IgG: <0.91 index (ref 0.00–0.90)
HSV 2 Glycoprotein G Ab, IgG: <0.91 index (ref 0.00–0.90)

## 2023-03-23 LAB — TESTOSTERONE: Testosterone: 52 ng/dL (ref 13–71)

## 2023-03-23 MED ORDER — ZIPRASIDONE MESYLATE 20 MG IM SOLR: 20.0000 mg | INTRAMUSCULAR | Status: DC | PRN

## 2023-03-23 MED ORDER — HALOPERIDOL LACTATE 5 MG/ML IJ SOLN: 5.0000 mg | Freq: Three times a day (TID) | INTRAMUSCULAR | Status: DC | PRN

## 2023-03-23 MED ORDER — DIPHENHYDRAMINE HCL 50 MG/ML IJ SOLN: 50.0000 mg | Freq: Three times a day (TID) | INTRAMUSCULAR | Status: DC | PRN

## 2023-03-23 MED ORDER — TRAZODONE HCL 50 MG PO TABS
50.0000 mg | ORAL_TABLET | Freq: Every evening | ORAL | Status: DC | PRN
  Administered 2023-03-23 – 2023-03-29 (×5): 50 mg via ORAL
  Filled 2023-03-23: qty 7
  Filled 2023-03-23 (×5): qty 1

## 2023-03-23 MED ORDER — ALUM & MAG HYDROXIDE-SIMETH 200-200-20 MG/5ML PO SUSP: 30.0000 mL | ORAL | Status: DC | PRN

## 2023-03-23 MED ORDER — LORAZEPAM 1 MG PO TABS
2.0000 mg | ORAL_TABLET | Freq: Three times a day (TID) | ORAL | Status: DC | PRN
  Filled 2023-03-23: qty 2

## 2023-03-23 MED ORDER — HALOPERIDOL 5 MG PO TABS
5.0000 mg | ORAL_TABLET | Freq: Three times a day (TID) | ORAL | Status: DC | PRN
  Filled 2023-03-23 (×2): qty 1

## 2023-03-23 MED ORDER — QUETIAPINE FUMARATE 25 MG PO TABS: 50.0000 mg | ORAL_TABLET | Freq: Every day | ORAL | Status: DC

## 2023-03-23 MED ORDER — LORAZEPAM 2 MG/ML IJ SOLN: 2.0000 mg | Freq: Three times a day (TID) | INTRAMUSCULAR | Status: DC | PRN

## 2023-03-23 MED ORDER — OLANZAPINE 10 MG PO TBDP: 5.0000 mg | ORAL_TABLET | Freq: Three times a day (TID) | ORAL | Status: DC | PRN

## 2023-03-23 MED ORDER — LORAZEPAM 1 MG PO TABS: 1.0000 mg | ORAL_TABLET | Freq: Every day | ORAL | Status: DC | PRN

## 2023-03-23 MED ORDER — LORAZEPAM 1 MG PO TABS: 1.0000 mg | ORAL_TABLET | ORAL | Status: DC | PRN

## 2023-03-23 MED ORDER — HYDROXYZINE HCL 25 MG PO TABS
25.0000 mg | ORAL_TABLET | Freq: Three times a day (TID) | ORAL | Status: DC | PRN
  Administered 2023-03-23: 25 mg via ORAL
  Filled 2023-03-23: qty 1

## 2023-03-23 MED ORDER — MAGNESIUM HYDROXIDE 400 MG/5ML PO SUSP: 30.0000 mL | Freq: Every day | ORAL | Status: DC | PRN

## 2023-03-23 MED ORDER — DIPHENHYDRAMINE HCL 25 MG PO CAPS
50.0000 mg | ORAL_CAPSULE | Freq: Three times a day (TID) | ORAL | Status: DC | PRN
  Filled 2023-03-23: qty 2

## 2023-03-23 MED ORDER — NICOTINE POLACRILEX 2 MG MT GUM: 2.0000 mg | CHEWING_GUM | OROMUCOSAL | Status: DC | PRN

## 2023-03-23 MED ORDER — ACETAMINOPHEN 325 MG PO TABS: 650.0000 mg | ORAL_TABLET | Freq: Four times a day (QID) | ORAL | Status: DC | PRN

## 2023-03-23 NOTE — ED Notes (Signed)
[place]  [employer]  [place]PT  CALLED  FOR  TRANSPORT  TO [facility] ?

## 2023-03-23 NOTE — ED Provider Notes (Signed)
Emergency Medicine Observation Re-evaluation Note  Kathleen Lucas is a 28 y.o. female, seen on rounds today.  Pt initially presented to the ED for complaints of IVC    Physical Exam  BP 117/70 (BP Location: Right Arm)   Pulse 85   Temp 98.1 F (36.7 C) (Oral)   Resp 17   Ht 5\' 2"  (1.575 m)   Wt 58.1 kg   SpO2 98%   BMI 23.41 kg/m  Physical Exam General: nad  ED Course / MDM  EKG:   I have reviewed the labs performed to date as well as medications administered while in observation.   .  Plan  Current plan is for psych.soc.    Willy Eddy, MD 03/23/23 [phone]

## 2023-03-23 NOTE — ED Notes (Addendum)
Patient agitated and appears defiant. Pt pulled doors off of room. Continues to drag chair into bathroom. Chair and doors removed. Pt states "the people with the headphones know what is going on". Pt demanding this RN call her mom to come pick her up. Pt explained that this RN unable to allow her to leave at this time as she is IVC'd and needs to stay here until cleared by psych.

## 2023-03-23 NOTE — Tx Team (Signed)
Initial Treatment Plan 03/23/2023 11:11 PM KORINE WINTON ZOX:[id number]    PATIENT STRESSORS: Marital or family conflict   Medication change or noncompliance     PATIENT STRENGTHS: General fund of knowledge  Motivation for treatment/growth    PATIENT IDENTIFIED PROBLEMS: mania  anxiety  "I don't know"                 DISCHARGE CRITERIA:  Improved stabilization in mood, thinking, and/or behavior Verbal commitment to aftercare and medication compliance  PRELIMINARY DISCHARGE PLAN: Attend PHP/IOP Attend 12-step recovery group Outpatient therapy  PATIENT/FAMILY INVOLVEMENT: This treatment plan has been presented to and reviewed with the patient, [employer]  The patient and family have been given the opportunity to ask questions and make suggestions.  Delos Haring, RN 03/23/2023, 11:11 PM

## 2023-03-23 NOTE — ED Notes (Signed)
Pt informed of acceptance to [facility]. Pt states she is not aware of why she needs to go but is agreeable. Pt given phone to call mother and inform mother of transfer.

## 2023-03-23 NOTE — Plan of Care (Signed)
  Problem: Safety: Goal: Periods of time without injury will increase Outcome: Progressing   Problem: Activity: Goal: Sleeping patterns will improve Outcome: Progressing   

## 2023-03-23 NOTE — ED Notes (Signed)
Pt requesting something to eat, given afternoon snack.

## 2023-03-23 NOTE — ED Notes (Signed)
Pt becoming upset, wanting to go home. Pt informed she needs to stay here a while longer. Pt states "it seems like the Dr. Is up in the clouds instead of down here like everyone else". NT offered to bring patient a book to read to occupy her time, pt refused. Pt given lunch tray at this time.

## 2023-03-23 NOTE — ED Notes (Signed)
Pt agreeable to EKG. Requesting shower after EKG obtained. Pt given supplies for shower.
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
## 2023-03-23 NOTE — ED Notes (Signed)
Pt agitated. Unable to get EKG at this time.

## 2023-03-24 ENCOUNTER — Encounter (HOSPITAL_COMMUNITY): Payer: Self-pay

## 2023-03-24 DIAGNOSIS — F23 Brief psychotic disorder: Secondary | ICD-10-CM

## 2023-03-24 DIAGNOSIS — F4310 Post-traumatic stress disorder, unspecified: Secondary | ICD-10-CM | POA: Insufficient documentation

## 2023-03-24 DIAGNOSIS — F29 Unspecified psychosis not due to a substance or known physiological condition: Secondary | ICD-10-CM

## 2023-03-24 DIAGNOSIS — F1290 Cannabis use, unspecified, uncomplicated: Secondary | ICD-10-CM | POA: Insufficient documentation

## 2023-03-24 DIAGNOSIS — F410 Panic disorder [episodic paroxysmal anxiety] without agoraphobia: Secondary | ICD-10-CM | POA: Insufficient documentation

## 2023-03-24 MED ORDER — ONDANSETRON HCL 4 MG PO TABS: 8.0000 mg | ORAL_TABLET | Freq: Three times a day (TID) | ORAL | Status: DC | PRN

## 2023-03-24 MED ORDER — RISPERIDONE 1 MG PO TABS
1.0000 mg | ORAL_TABLET | Freq: Two times a day (BID) | ORAL | Status: AC
  Administered 2023-03-25 (×2): 1 mg via ORAL
  Filled 2023-03-24 (×5): qty 1

## 2023-03-24 MED ORDER — BISMUTH SUBSALICYLATE 262 MG PO CHEW: 524.0000 mg | CHEWABLE_TABLET | ORAL | Status: DC | PRN

## 2023-03-24 MED ORDER — SENNA 8.6 MG PO TABS: 1.0000 | ORAL_TABLET | Freq: Every evening | ORAL | Status: DC | PRN

## 2023-03-24 MED ORDER — POLYETHYLENE GLYCOL 3350 17 G PO PACK
17.0000 g | PACK | Freq: Every day | ORAL | Status: DC | PRN
  Administered 2023-03-27: 17 g via ORAL
  Filled 2023-03-24: qty 1

## 2023-03-24 MED ORDER — RISPERIDONE 1 MG PO TABS
1.0000 mg | ORAL_TABLET | Freq: Once | ORAL | Status: AC
  Administered 2023-03-24: 1 mg via ORAL
  Filled 2023-03-24 (×2): qty 1

## 2023-03-24 MED ORDER — HYDROXYZINE HCL 25 MG PO TABS
25.0000 mg | ORAL_TABLET | Freq: Three times a day (TID) | ORAL | Status: DC | PRN
  Administered 2023-03-24 – 2023-03-29 (×4): 25 mg via ORAL
  Filled 2023-03-24: qty 10
  Filled 2023-03-24 (×4): qty 1

## 2023-03-24 MED ORDER — ALUM & MAG HYDROXIDE-SIMETH 200-200-20 MG/5ML PO SUSP: 30.0000 mL | ORAL | Status: DC | PRN

## 2023-03-24 MED ORDER — ACETAMINOPHEN 325 MG PO TABS: 650.0000 mg | ORAL_TABLET | Freq: Four times a day (QID) | ORAL | Status: DC | PRN

## 2023-03-24 NOTE — Progress Notes (Signed)
After pt took sleep medication, pt wanted to stand in the hall , pt was informed that if she did not want to go to sleep she could stand in her room, but was encouraged to lay down and try to go to sleep. Pt began to play with the lights in her room cutting them off and on, then proceeded to press the STARR button , then pt stated "It wasn't me" , but when asked who pressed the button pt would not answer. Pt was encouraged to try to lay down and get some sleep
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
## 2023-03-24 NOTE — Progress Notes (Signed)
Recreation Therapy Notes  INPATIENT RECREATION THERAPY ASSESSMENT  Patient Details Name: Kathleen Lucas MRN: [id number] DOB: November 26, 1995 Today's Date: 03/24/2023       Information Obtained From: Patient  Able to Participate in Assessment/Interview: Yes (Pt had random laughing throughout assessment.)  Patient Presentation: Alert  Reason for Admission (Per Patient): Other (Comments) (Pt stated "other people thinking they know what I want".)  Patient Stressors: Other (Comment) ("chaos, arguing, couldn't set differences aside")  Coping Skills:   Isolation, Sports, Music, Dance, Other (Comment) (Stretching)  Leisure Interests (2+):  Individual - Other (Comment), Exercise - Walking (Walking around playing Pokemon; Eating something I enjoy)  Frequency of Recreation/Participation: Other (Comment) (Not in awhile)  Awareness of Community Resources:  Yes  Community Resources:  Library, [employer]  Current Use: Yes  If no, Barriers?:    Expressed Interest in [employer] Information: No  [employer] of Residence:  [place]  Patient Main Form of Transportation: Walk  Patient Strengths:  "whatever I put my mind to; ignoring people when they don't benefit me but still listen to their side of the story"  Patient Identified Areas of Improvement:  Communication  Patient Goal for Hospitalization:  "to tell the truth, the whole truth so help me God"  Current SI (including self-harm):  No  Current HI:  No  Current AVH: No  Staff Intervention Plan: Group Attendance, Collaborate with Interdisciplinary Treatment Team  Consent to Intern Participation: N/A   Hulda Reddix-McCall, LRT,CTRS Allyanna Appleman A Samarie Pinder-McCall 03/24/2023, 1:21 PM

## 2023-03-24 NOTE — Plan of Care (Signed)
  Problem: Education: Goal: Knowledge of [facility] Education information/materials will improve Outcome: Progressing Goal: Emotional status will improve Outcome: Progressing Goal: Mental status will improve Outcome: Progressing Goal: Verbalization of understanding the information provided will improve Outcome: Progressing   Problem: Activity: Goal: Interest or engagement in activities will improve Outcome: Progressing Goal: Sleeping patterns will improve Outcome: Progressing   Problem: Coping: Goal: Ability to verbalize frustrations and anger appropriately will improve Outcome: Progressing Goal: Ability to demonstrate self-control will improve Outcome: Progressing   Problem: Health Behavior/Discharge Planning: Goal: Identification of resources available to assist in meeting health care needs will improve Outcome: Progressing Goal: Compliance with treatment plan for underlying cause of condition will improve Outcome: Progressing   Problem: Physical Regulation: Goal: Ability to maintain clinical measurements within normal limits will improve Outcome: Progressing   Problem: Safety: Goal: Periods of time without injury will increase Outcome: Progressing   Problem: Education: Goal: Knowledge of [facility] Education information/materials will improve Outcome: Progressing Goal: Emotional status will improve Outcome: Progressing Goal: Mental status will improve Outcome: Progressing Goal: Verbalization of understanding the information provided will improve Outcome: Progressing   Problem: Activity: Goal: Interest or engagement in activities will improve Outcome: Progressing Goal: Sleeping patterns will improve Outcome: Progressing   Problem: Coping: Goal: Ability to verbalize frustrations and anger appropriately will improve Outcome: Progressing Goal: Ability to demonstrate self-control will improve Outcome: Progressing   Problem: Health  Behavior/Discharge Planning: Goal: Identification of resources available to assist in meeting health care needs will improve Outcome: Progressing Goal: Compliance with treatment plan for underlying cause of condition will improve Outcome: Progressing   Problem: Physical Regulation: Goal: Ability to maintain clinical measurements within normal limits will improve Outcome: Progressing   Problem: Safety: Goal: Periods of time without injury will increase Outcome: Progressing

## 2023-03-24 NOTE — Plan of Care (Signed)
  Problem: Education: Goal: Emotional status will improve Outcome: Progressing Goal: Mental status will improve Outcome: Progressing   Problem: Education: Goal: Verbalization of understanding the information provided will improve Outcome: Progressing   Problem: Activity: Goal: Sleeping patterns will improve Outcome: Progressing

## 2023-03-24 NOTE — Progress Notes (Signed)
Adult Psychoeducational Group Note  Date:  03/24/2023 Time:  11:03 AM  Group Topic/Focus:  Goals Group:   The focus of this group is to help patients establish daily goals to achieve during treatment and discuss how the patient can incorporate goal setting into their daily lives to aide in recovery.  Participation Level:  Active  Participation Quality:  Appropriate  Affect:  Appropriate  Cognitive:  Appropriate  Insight: Appropriate  Engagement in Group:  Engaged  Modes of Intervention:  Discussion  Additional Comments: The  patient  expressed that her goal is to be happy.  Octavio Manns 03/24/2023, 11:03 AM

## 2023-03-24 NOTE — [facility] Suicide Risk Assessment (Cosign Needed Addendum)
Suicide Risk Assessment  Admission Assessment    [facility] Admission Suicide Risk Assessment  Nursing information obtained from:  Patient Demographic factors:  Caucasian, Living alone Current Mental Status:  NA Loss Factors:  Decrease in vocational status, Loss of significant relationship Historical Factors:  Impulsivity Risk Reduction Factors:  Employed  Total Time spent with patient: 1 hour Principal Problem: Psychosis (HCC) Diagnosis:  Principal Problem:   Unspecified psychotic disorder Active Problems:   PTSD (post-traumatic stress disorder)   Panic attacks   Cannabis use disorder   Subjective Data: patient denies passive or active SI. No concerns of this on chart review  Continued Clinical Symptoms:  Alcohol Use Disorder Identification Test Final Score (AUDIT): 1 The "Alcohol Use Disorders Identification Test", Guidelines for Use in Primary Care, Second Edition.  World Science writer [facility]). Score between 0-7:  no or low risk or alcohol related problems. Score between 8-15:  moderate risk of alcohol related problems. Score between 16-19:  high risk of alcohol related problems. Score 20 or above:  warrants further diagnostic evaluation for alcohol dependence and treatment.  CLINICAL FACTORS:   Alcohol/Substance Abuse/Dependencies More than one psychiatric diagnosis Currently Psychotic  Musculoskeletal: Strength & Muscle Tone: within normal limits Gait & Station: normal Patient leans: N/A  Psychiatric Specialty Exam  Presentation  General Appearance:  Casual  Eye Contact: Fair  Speech: Clear and Coherent  Speech Volume: Normal  Handedness: Right   Mood and Affect  Mood: -- ("my mom thinks I'm crazy")  Duration of Depression Symptoms: No data recorded Affect: Restricted; Inappropriate; Labile   Thought Process  Thought Processes: Disorganized  Descriptions of Associations: Loose  Orientation: Full (Time, Place and Person)  Thought  Content: Delusions  History of Schizophrenia/Schizoaffective disorder: No  Duration of Psychotic Symptoms: N/A  Hallucinations: Hallucinations: None  Ideas of Reference: Paranoia; Delusions  Suicidal Thoughts: Suicidal Thoughts: No  Homicidal Thoughts: Homicidal Thoughts: No   Sensorium  Memory: Immediate Good; Recent Poor; Remote Poor  Judgment: Fair  Insight: Lacking   Executive Functions  Concentration: Poor  Attention Span: Poor  Recall: Poor  Fund of Knowledge: Fair  Language: Fair   Psychomotor Activity  Psychomotor Activity: Psychomotor Activity: Normal   Assets  Assets: Physical Health   Sleep  Sleep: Sleep: Poor   Physical Exam: Physical Exam HENT:     Head: Normocephalic.  Pulmonary:     Effort: Pulmonary effort is normal.  Neurological:     General: No focal deficit present.     Mental Status: She is alert.    Review of Systems  Constitutional: Negative.   Respiratory: Negative.    Cardiovascular: Negative.   Gastrointestinal: Negative.   Genitourinary: Negative.    Blood pressure 112/64, pulse 88, temperature 98.3 F (36.8 C), temperature source Oral, resp. rate 18, height 5\' 2"  (1.575 m), weight 58.1 kg, last menstrual period 03/16/2023, SpO2 100%. Body mass index is 23.41 kg/m.  COGNITIVE FEATURES THAT CONTRIBUTE TO RISK:  Loss of executive function    SUICIDE RISK:   Mild:  Suicidal ideation of limited frequency, intensity, duration, and specificity.  There are no identifiable plans, no associated intent, mild dysphoria and related symptoms, good self-control (both objective and subjective assessment), few other risk factors, and identifiable protective factors, including available and accessible social support.  PLAN OF CARE: see H&P for full plan of care  I certify that inpatient services furnished can reasonably be expected to improve the patient's condition.   Signed: Augusto Gamble, MD 03/24/2023, 11:50  AM
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
## 2023-03-24 NOTE — B[facility] Counselor (Signed)
Adult Comprehensive Assessment  Patient ID: Kathleen Lucas, female   DOB: 03/28/1996, 27 y.o.   MRN: [id number]  Information Source: Information source: Patient  Current Stressors:  Patient states their primary concerns and needs for treatment are:: 27 y/o female presents to [facility] under IVC. Pt presents as disorganized and agitated. Pt asserts that she has multiple stressors to include: ,Maritial conflict, suspension from work and family estrangement. Pt appears to be paranoid and suspiscious of other to include her spouse. Pt denies SI/HI. Patient states their goals for this hospitilization and ongoing recovery are:: Medication Stabilization Educational / Learning stressors: none reported Employment / Job issues: pt reports being suspended Family Relationships: Estranged Surveyor, quantity / Lack of resources (include bankruptcy): suspended without pay Housing / Lack of housing: homeless Physical health (include injuries & life threatening diseases): none Social relationships: pt denied Substance abuse: THC Bereavement / Loss: none reportedd  Living/Environment/Situation:  Living Arrangements: Alone Who else lives in the home?: I was living with my Spouse How Mcmurphy has patient lived in current situation?: I don't remember What is atmosphere in current home: Chaotic, Temporary  Family History:  Marital status: Married Number of Years Married: 5 Are you sexually active?: No What is your sexual orientation?: Bisexual but I prefer men Has your sexual activity been affected by drugs, alcohol, medication, or emotional stress?: Emotional Does patient have children?: No  Childhood History:  By whom was/is the patient raised?: Mother Description of patient's relationship with caregiver when they were a child: My mom and dad did not do well at co-parenting Patient's description of current relationship with people who raised him/her: "If my mom would bring my stuff up here it would be great" How were  you disciplined when you got in trouble as a child/adolescent?: I had no guidance Does patient have siblings?: Yes Number of Siblings: 8 Description of patient's current relationship with siblings: "Rocky with most of them" Did patient suffer any verbal/emotional/physical/sexual abuse as a child?: No Did patient suffer from severe childhood neglect?: No Has patient ever been sexually abused/assaulted/raped as an adolescent or adult?: No Was the patient ever a victim of a crime or a disaster?: No Witnessed domestic violence?: No Has patient been affected by domestic violence as an adult?: No  Education:  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?: No  Financial Resources:   Financial resources: Income from spouse Does patient have a representative payee or guardian?: No  Alcohol/Substance Abuse:   What has been your use of drugs/alcohol within the last 12 months?: I smoke Marijuana sometimes Alcohol/Substance Abuse Treatment Hx: Denies past history Has alcohol/substance abuse ever caused legal problems?: No  Social Support System:   Forensic psychologist System: None Describe Community Support System: I don't have any support Type of faith/religion: "I believe in God" How does patient's faith help to cope with current illness?: DNA  Leisure/Recreation:   Do You Have Hobbies?: Yes Leisure and Hobbies: Crafts  Strengths/Needs:   What is the patient's perception of their strengths?:  Balance  Patient states they can use these personal strengths during their treatment to contribute to their recovery: DNA Patient states these barriers may affect/interfere with their treatment: "People keep stealing my stuff" Patient states these barriers may affect their return to the community: none reported  Discharge Plan:   Currently receiving community mental health services: No Patient states concerns and preferences for aftercare planning are: pt denied Patient states they will know when they are safe and ready for discharge when: "Once I find out where I am going to be living" Does patient have access to transportation?: No Does patient have financial barriers related to discharge medications?: No Patient description of barriers related to discharge medications: none reported Plan for no access to transportation at discharge: I don't know" Will patient be returning to same living situation after discharge?:  (Unsure)  Summary/Recommendations:   Summary and Recommendations (to be completed by the evaluator): 27 y/o female pt presents to [facility] involuntarily and asserts that she does not know why she was admitted to the hospital. Pt presents as disorganized and agitated. Pt has multiple psycho-stressors to include: homeless, family conflict and lacks social supports. Pt currently denies SI/HI. Marland KitchenWhile here, Kianna can benefit from crisis stabilization, medication management, therapeutic milieu, and referrals for services.  Trivia Heffelfinger S Lakechia Nay. 03/24/2023

## 2023-03-24 NOTE — Group Note (Signed)
Recreation Therapy Group Note   Group Topic:Team Building  Group Date: 03/24/2023 Start Time: 1006 End Time: 1026 Facilitators: Avondre Richens-McCall, LRT,CTRS Location: [address] Dayroom   Goal Area(s) Addresses:  Patient will effectively work with peer towards shared goal.  Patient will identify skills used to make activity successful.  Patient will identify how skills used during activity can be applied to reach post d/c goals.   Group Description: [employer]. In teams of 5-6, patients were given 11 craft pipe cleaners. Using the materials provided, patients were instructed to compete again the opposing team(s) to build the tallest free-standing structure from floor level. The activity was timed; difficulty increased by Clinical research associate as Production designer, theatre/television/film continued.  Systematically resources were removed with additional directions for example, placing one arm behind their back, working in silence, and shape stipulations. LRT facilitated post-activity discussion reviewing team processes and necessary communication skills involved in completion. Patients were encouraged to reflect how the skills utilized, or not utilized, in this activity can be incorporated to positively impact support systems post discharge.   Affect/Mood: N/A   Participation Level: Did not attend    Clinical Observations/Individualized Feedback:     Plan: Continue to engage patient in RT group sessions 2-3x/week.   Irisha Grandmaison-McCall, LRT,CTRS 03/24/2023 12:08 PM

## 2023-03-24 NOTE — Progress Notes (Signed)
Dar Note: Patient presents with irritable mood and affect.  Patient appears guarded, worried and preoccupied.  Observed responding to internal stimuli, smiling and talking to herself while in the dayroom.  Needed a lot of encouragement before taking her medication.  Routine safety checks maintained.  Patient is safe on the unit.

## 2023-03-24 NOTE — BH IP Treatment Plan (Signed)
Interdisciplinary Treatment and Diagnostic Plan Update  03/24/2023 Time of Session: 11:00am Kathleen Lucas MRN: [id number]  Principal Diagnosis: Psychoactive substance-induced psychosis (HCC)  Secondary Diagnoses: Principal Problem:   Psychoactive substance-induced psychosis (HCC)   Current Medications:  Current Facility-Administered Medications  Medication Dose Route Frequency Provider Last Rate Last Admin   acetaminophen (TYLENOL) tablet 650 mg  650 mg Oral Q6H PRN Augusto Gamble, MD       alum & mag hydroxide-simeth (MAALOX/MYLANTA) 200-200-20 MG/5ML suspension 30 mL  30 mL Oral Q4H PRN Augusto Gamble, MD       bismuth subsalicylate (PEPTO BISMOL) chewable tablet 524 mg  524 mg Oral Q3H PRN Augusto Gamble, MD       diphenhydrAMINE (BENADRYL) capsule 50 mg  50 mg Oral TID PRN Jearld Lesch, NP       Or   diphenhydrAMINE (BENADRYL) injection 50 mg  50 mg Intramuscular TID PRN Jearld Lesch, NP       haloperidol (HALDOL) tablet 5 mg  5 mg Oral TID PRN Jearld Lesch, NP       Or   haloperidol lactate (HALDOL) injection 5 mg  5 mg Intramuscular TID PRN Jearld Lesch, NP       hydrOXYzine (ATARAX) tablet 25 mg  25 mg Oral TID PRN Augusto Gamble, MD       LORazepam (ATIVAN) tablet 2 mg  2 mg Oral TID PRN Jearld Lesch, NP       Or   LORazepam (ATIVAN) injection 2 mg  2 mg Intramuscular TID PRN Jearld Lesch, NP       ondansetron (ZOFRAN) tablet 8 mg  8 mg Oral Q8H PRN Augusto Gamble, MD       polyethylene glycol (MIRALAX / GLYCOLAX) packet 17 g  17 g Oral Daily PRN Augusto Gamble, MD       risperiDONE (RISPERDAL) tablet 1 mg  1 mg Oral Once Augusto Gamble, MD       Followed by   Melene Muller ON 03/25/2023] risperiDONE (RISPERDAL) tablet 1 mg  1 mg Oral Q12H Augusto Gamble, MD       senna Mancel Parsons) tablet 8.6 mg  1 tablet Oral QHS PRN Augusto Gamble, MD       traZODone (DESYREL) tablet 50 mg  50 mg Oral QHS PRN Jearld Lesch, NP   50 mg at 03/23/23 2357   PTA Medications: Medications Prior to  Admission  Medication Sig Dispense Refill Last Dose   albuterol (PROVENTIL HFA;VENTOLIN HFA) 108 (90 Base) MCG/ACT inhaler Inhale 1-2 puffs into the lungs every 6 (six) hours as needed for wheezing or shortness of breath. 1 Inhaler 0    amoxicillin-clavulanate (AUGMENTIN) 875-125 MG tablet Take 1 tablet by mouth 2 (two) times daily for 7 days. 14 tablet 0     Patient Stressors: Marital or family conflict   Medication change or noncompliance    Patient Strengths: Automotive engineer for treatment/growth   Treatment Modalities: Medication Management, Group therapy, Case management,  1 to 1 session with clinician, Psychoeducation, Recreational therapy.   Physician Treatment Plan for Primary Diagnosis: Psychoactive substance-induced psychosis (HCC) [place] Term Goal(s):     Short Term Goals:    Medication Management: Evaluate patient's response, side effects, and tolerance of medication regimen.  Therapeutic Interventions: 1 to 1 sessions, Unit Group sessions and Medication administration.  Evaluation of Outcomes: Not Met  Physician Treatment Plan for Secondary Diagnosis: Principal Problem:   Psychoactive substance-induced psychosis (HCC)  [place] Term Goal(s):     Short Term Goals:       Medication Management: Evaluate patient's response, side effects, and tolerance of medication regimen.  Therapeutic Interventions: 1 to 1 sessions, Unit Group sessions and Medication administration.  Evaluation of Outcomes: Not Met   RN Treatment Plan for Primary Diagnosis: Psychoactive substance-induced psychosis (HCC) [place] Term Goal(s): Knowledge of disease and therapeutic regimen to maintain health will improve  Short Term Goals: Ability to remain free from injury will improve, Ability to verbalize frustration and anger appropriately will improve, Ability to demonstrate self-control, Ability to participate in decision making will improve, Ability to verbalize feelings will  improve, Ability to disclose and discuss suicidal ideas, Ability to identify and develop effective coping behaviors will improve, and Compliance with prescribed medications will improve  Medication Management: RN will administer medications as ordered by provider, will assess and evaluate patient's response and provide education to patient for prescribed medication. RN will report any adverse and/or side effects to prescribing provider.  Therapeutic Interventions: 1 on 1 counseling sessions, Psychoeducation, Medication administration, Evaluate responses to treatment, Monitor vital signs and CBGs as ordered, Perform/monitor CIWA, COWS, AIMS and Fall Risk screenings as ordered, Perform wound care treatments as ordered.  Evaluation of Outcomes: Not Met   LCSW Treatment Plan for Primary Diagnosis: Psychoactive substance-induced psychosis (HCC) [place] Term Goal(s): Safe transition to appropriate next level of care at discharge, Engage patient in therapeutic group addressing interpersonal concerns.  Short Term Goals: Engage patient in aftercare planning with referrals and resources, Increase social support, Increase ability to appropriately verbalize feelings, Increase emotional regulation, Facilitate acceptance of mental health diagnosis and concerns, Facilitate patient progression through stages of change regarding substance use diagnoses and concerns, Identify triggers associated with mental health/substance abuse issues, and Increase skills for wellness and recovery  Therapeutic Interventions: Assess for all discharge needs, 1 to 1 time with Social worker, Explore available resources and support systems, Assess for adequacy in community support network, Educate family and significant other(s) on suicide prevention, Complete Psychosocial Assessment, Interpersonal group therapy.  Evaluation of Outcomes: Not Met   Progress in Treatment: Attending groups: Yes. Participating in groups: Yes. Taking  medication as prescribed: Yes. Toleration medication: No. Family/Significant other contact made: No, will contact:  will contact when permission is given Patient understands diagnosis: No. Discussing patient identified problems/goals with staff: No. Medical problems stabilized or resolved: No. Denies suicidal/homicidal ideation: Yes. Issues/concerns per patient self-inventory: No. Other: none reported  New problem(s) identified: No, Describe:  none reported  New Short Term/[place] Term Goal(s): ): medication stabilization  Patient Goals:  coping skills, emotional regulation  Discharge Plan or Barriers: Patient recently admitted. CSW will continue to follow and assess for appropriate referrals and possible discharge planning.    Reason for Continuation of Hospitalization: Hallucinations, delusions, mania, agitation  Estimated Length of Stay: 3-7 days  Last 3 [place] Suicide Severity Risk Score: Flowsheet Row Ad[facility] (Current) [facility] from 03/21/2023 in [facility] Department at [facility] ED from 03/19/2023 in [facility] Emergency Department at [facility]  C-SSRS RISK CATEGORY Low Risk Low Risk No Risk       Last PHQ 2/9 Scores:     No data to display          Scribe for Treatment Team: Starleen Arms, [place] 03/24/2023 11:35 AM

## 2023-03-24 NOTE — H&P (Deleted)
Suicide Risk Assessment  Admission Assessment    [facility] Admission Suicide Risk Assessment  Nursing information obtained from:  Patient Demographic factors:  Caucasian, Living alone Current Mental Status:  NA Loss Factors:  Decrease in vocational status, Loss of significant relationship Historical Factors:  Impulsivity Risk Reduction Factors:  Employed  Total Time spent with patient: 1 hour Principal Problem: Psychosis (HCC) Diagnosis:  Principal Problem:   Unspecified psychotic disorder Active Problems:   PTSD (post-traumatic stress disorder)   Panic attacks   Cannabis use disorder   Subjective Data: patient denies passive or active suicidal ideations  Continued Clinical Symptoms:  Alcohol Use Disorder Identification Test Final Score (AUDIT): 1 The "Alcohol Use Disorders Identification Test", Guidelines for Use in Primary Care, Second Edition.  World Science writer [facility]). Score between 0-7:  no or low risk or alcohol related problems. Score between 8-15:  moderate risk of alcohol related problems. Score between 16-19:  high risk of alcohol related problems. Score 20 or above:  warrants further diagnostic evaluation for alcohol dependence and treatment.  CLINICAL FACTORS:   More than one psychiatric diagnosis Currently Psychotic  Musculoskeletal: Strength & Muscle Tone: within normal limits Gait & Station: normal Patient leans: N/A  Psychiatric Specialty Exam  Presentation  General Appearance:  Casual  Eye Contact: Fair  Speech: Clear and Coherent  Speech Volume: Normal  Handedness: Right   Mood and Affect  Mood: -- ("my mom thinks I'm crazy")  Duration of Depression Symptoms: No data recorded Affect: Restricted; Inappropriate; Labile   Thought Process  Thought Processes: Disorganized  Descriptions of Associations: Loose  Orientation: Full (Time, Place and Person)  Thought Content: Delusions  History of Schizophrenia/Schizoaffective  disorder: No  Duration of Psychotic Symptoms: N/A  Hallucinations: Hallucinations: None  Ideas of Reference: Paranoia; Delusions  Suicidal Thoughts: Suicidal Thoughts: No  Homicidal Thoughts: Homicidal Thoughts: No   Sensorium  Memory: Immediate Good; Recent Poor; Remote Poor  Judgment: Fair  Insight: Lacking   Executive Functions  Concentration: Poor  Attention Span: Poor  Recall: Poor  Fund of Knowledge: Fair  Language: Fair   Psychomotor Activity  Psychomotor Activity: Psychomotor Activity: Normal   Assets  Assets: Physical Health   Sleep  Sleep: Sleep: Poor   Physical Exam: Physical Exam HENT:     Head: Normocephalic.  Pulmonary:     Effort: Pulmonary effort is normal.  Neurological:     General: No focal deficit present.     Mental Status: She is alert.    Review of Systems  Constitutional: Negative.   Respiratory: Negative.    Cardiovascular: Negative.   Gastrointestinal: Negative.   Genitourinary: Negative.    Blood pressure 112/64, pulse 88, temperature 98.3 F (36.8 C), temperature source Oral, resp. rate 18, height 5\' 2"  (1.575 m), weight 58.1 kg, last menstrual period 03/16/2023, SpO2 100%. Body mass index is 23.41 kg/m.  COGNITIVE FEATURES THAT CONTRIBUTE TO RISK:  Loss of executive function    SUICIDE RISK:   Mild:  Suicidal ideation of limited frequency, intensity, duration, and specificity.  There are no identifiable plans, no associated intent, mild dysphoria and related symptoms, good self-control (both objective and subjective assessment), few other risk factors, and identifiable protective factors, including available and accessible social support.  PLAN OF CARE: see H&P for full plan of care  I certify that inpatient services furnished can reasonably be expected to improve the patient's condition.   Signed: Augusto Gamble, MD 03/24/2023, 11:48 AM

## 2023-03-24 NOTE — B[facility] Group Notes (Signed)
Adult Psychoeducational Group Note  Date:  03/24/2023 Time:  9:15 PM  Group Topic/Focus:  Wrap-Up Group:   The focus of this group is to help patients review their daily goal of treatment and discuss progress on daily workbooks.  Participation Level:  Active  Participation Quality:  Appropriate  Affect:  Appropriate  Cognitive:  Appropriate  Insight: Appropriate  Engagement in Group:  Engaged  Modes of Intervention:  Discussion  Additional Comments:  Pt. Attended group, stated day was 8 out of 10, Pt. Stated goal for tomorrow is to find out when they leaving.  Joselyn Arrow 03/24/2023, 9:15 PM

## 2023-03-24 NOTE — Progress Notes (Signed)
   03/24/23 2000  Psych Admission Type (Psych Patients Only)  Admission Status Other (Comment)  Psychosocial Assessment  Patient Complaints Suspiciousness  Eye Contact Fair  Facial Expression Sad;Worried;Fixed smile  Affect Inconsistent with thought content  Speech Slow  Interaction Manipulative  Motor Activity Slow  Appearance/Hygiene Disheveled  Behavior Characteristics Guarded  Mood Suspicious;Preoccupied  Aggressive Behavior  Effect No apparent injury  Thought Process  Coherency Circumstantial;Disorganized  Content Blaming others;Delusions  Delusions Paranoid  Perception Derealization  Hallucination None reported or observed  Judgment Impaired  Confusion Mild  Danger to Self  Current suicidal ideation? Denies  Danger to Others  Danger to Others None reported or observed

## 2023-03-24 NOTE — Progress Notes (Signed)
Pt up complaining that she can not sleep , pt offered sleep medication. Pt given medication, held it in her mouth with the water and spit it back in the cup , pt informed by the writer she needs to drink the water if she wanted to take the medication to help her sleep. Pt drank the water and it appeared she took the medication.

## 2023-03-25 ENCOUNTER — Inpatient Hospital Stay (HOSPITAL_COMMUNITY)
Admit: 2023-03-25 | Discharge: 2023-03-25 | Disposition: A | Discharge status: Home / Self Care | Payer: No Typology Code available for payment source | Attending: Psychiatry | Admitting: Psychiatry

## 2023-03-25 ENCOUNTER — Encounter (HOSPITAL_COMMUNITY): Payer: Self-pay

## 2023-03-25 LAB — FOLATE: Folate: 7.3 ng/mL (ref >5.9)

## 2023-03-25 LAB — VITAMIN B12: Vitamin B-12: 183 pg/mL (ref 180–914)

## 2023-03-25 MED ORDER — RISPERIDONE 2 MG PO TABS
2.0000 mg | ORAL_TABLET | Freq: Two times a day (BID) | ORAL | Status: DC
  Administered 2023-03-26 – 2023-03-29 (×7): 2 mg via ORAL
  Filled 2023-03-25 (×11): qty 1

## 2023-03-25 MED ORDER — CALAMINE EX LOTN
1.0000 | TOPICAL_LOTION | CUTANEOUS | Status: DC | PRN
  Administered 2023-03-25 – 2023-03-29 (×4): 1 via TOPICAL
  Filled 2023-03-25: qty 177
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
## 2023-03-25 NOTE — Group Note (Signed)
Date:  03/25/2023 Time:  8:38 PM  Group Topic/Focus:  Wrap-Up Group:   The focus of this group is to help patients review their daily goal of treatment and discuss progress on daily workbooks.    Participation Level:  Active  Participation Quality:  Appropriate  Affect:  Irritable  Cognitive:  Lacking  Insight: Limited  Engagement in Group:  Engaged  Modes of Intervention:  Education and Exploration  Additional Comments:  Patient attended and participated in group tonight. She reports that today what was significant to her was was family.  Lita Mains [facility] 03/25/2023, 8:38 PM

## 2023-03-25 NOTE — Group Note (Signed)
Date:  03/25/2023 Time:  1:21 PM  Group Topic/Focus:  Goals Group:   The focus of this group is to help patients establish daily goals to achieve during treatment and discuss how the patient can incorporate goal setting into their daily lives to aide in recovery.    Participation Level:  Active  Participation Quality:  Appropriate  Affect:  Appropriate  Cognitive:  Appropriate  Insight: Appropriate  Engagement in Group:  Improving  Modes of Intervention:  Exploration   Kathleen Lucas 03/25/2023, 1:21 PM

## 2023-03-25 NOTE — Group Note (Signed)
Date:  03/25/2023 Time:  1:35 PM  Group Topic/Focus:  Healthy Communication:   The focus of this group is to discuss communication, barriers to communication, as well as healthy ways to communicate with others.    Participation Level:  Active  Participation Quality:  Appropriate  Affect:  Appropriate  Cognitive:  Appropriate  Insight: Improving  Engagement in Group:  Engaged  Modes of Intervention:  Discussion  Additional Comments:    Donell Beers 03/25/2023, 1:35 PM

## 2023-03-25 NOTE — Progress Notes (Signed)
   03/25/23 2200  Psych Admission Type (Psych Patients Only)  Admission Status Involuntary  Psychosocial Assessment  Patient Complaints Anxiety;Crying spells;Depression;Anger;Agitation  Eye Contact Fair  Facial Expression Anxious;Angry;Sad  Affect Depressed;Anxious;Irritable;Labile  Speech Rapid;Pressured  Associate Professor;Intrusive  Motor Activity Fidgety  Appearance/Hygiene Unremarkable  Behavior Characteristics Agitated;Anxious;Pacing;Irritable  Mood Labile;Anxious;Depressed  Thought Process  Coherency Circumstantial;Disorganized;Tangential  Content Blaming others  Delusions Somatic;Paranoid  Perception Derealization  Hallucination None reported or observed  Judgment Impaired  Confusion None  Danger to Self  Current suicidal ideation? Denies  Agreement Not to Harm Self Yes  Description of Agreement verbal  Danger to Others  Danger to Others None reported or observed

## 2023-03-25 NOTE — Progress Notes (Signed)
   03/25/23 0900  Psych Admission Type (Psych Patients Only)  Admission Status Involuntary  Psychosocial Assessment  Patient Complaints Suspiciousness  Eye Contact Fair  Facial Expression Worried  Affect Inconsistent with thought content  Speech Slow  Interaction Guarded  Motor Activity Slow  Appearance/Hygiene Unremarkable  Behavior Characteristics Guarded  Mood Preoccupied  Thought Process  Coherency Circumstantial;Disorganized  Content Blaming others;Delusions  Delusions Paranoid  Perception Derealization  Hallucination None reported or observed  Judgment Impaired  Confusion None  Danger to Self  Current suicidal ideation? Denies  Danger to Others  Danger to Others None reported or observed

## 2023-03-26 LAB — HIV ANTIBODY (ROUTINE TESTING W REFLEX): HIV Screen 4th Generation wRfx: NONREACTIVE

## 2023-03-26 NOTE — [facility] Suicide Risk Assessment (Signed)
[facility] INPATIENT:  Family/Significant Other Suicide Prevention Education  Suicide Prevention Education:  Education Completed; [place] Melvin [phone],  (name of family member/significant other) has been identified by the patient as the family member/significant other with whom the patient will be residing, and identified as the person(s) who will aid the patient in the event of a mental health crisis (suicidal ideations/suicide attempt).  With written consent from the patient, the family member/significant other has been provided the following suicide prevention education, prior to the and/or following the discharge of the patient.  CSW spoke with Mrs. Dibiasio, who denies any safety concerns with patient returning home. States that she has been in contact with her while admitted and "she is on her way" to baseline. Patient will reside next door to mother. Denies any access to weapons and/or firearms. Mother and spouse will continue to be a support to patient and encourage pt to follow outpatient treatment plan.  The family member/significant other verbalizes understanding of the suicide prevention education information provided.  The family member/significant other agrees to remove the items of safety concern listed above.  The suicide prevention education provided includes the following: Suicide risk factors Suicide prevention and interventions National Suicide Hotline telephone number [facility] assessment telephone number [facility] Emergency Assistance 911 [facility] and/or Residential Mobile Crisis Unit telephone number  Request made of family/significant other to: Remove weapons (e.g., guns, rifles, knives), all items previously/currently identified as safety concern.   Remove drugs/medications (over-the-counter, prescriptions, illicit drugs), all items previously/currently identified as a safety concern.   Bridgett Larsson 03/26/2023, 3:58 PM

## 2023-03-26 NOTE — Progress Notes (Signed)
   03/26/23 1000  Psych Admission Type (Psych Patients Only)  Admission Status Involuntary  Psychosocial Assessment  Patient Complaints Anxiety  Eye Contact Staring  Facial Expression Anxious;Fixed smile  Affect Anxious  Speech Pressured  Interaction Minimal  Motor Activity Fidgety  Appearance/Hygiene Unremarkable  Behavior Characteristics Anxious;Appropriate to situation  Mood Depressed  Thought Process  Coherency Circumstantial  Content Blaming others  Delusions Paranoid  Perception Derealization  Hallucination None reported or observed  Judgment Limited  Confusion None  Danger to Self  Current suicidal ideation? Denies  Agreement Not to Harm Self Yes  Description of Agreement verbal  Danger to Others  Danger to Others None reported or observed

## 2023-03-26 NOTE — Progress Notes (Signed)
   03/26/23 2127  Psych Admission Type (Psych Patients Only)  Admission Status Involuntary  Psychosocial Assessment  Patient Complaints None  Eye Contact Fair  Facial Expression Animated  Affect Depressed  Speech Rapid;Pressured  Interaction Assertive  Motor Activity Fidgety  Appearance/Hygiene Unremarkable  Behavior Characteristics Cooperative;Appropriate to situation  Mood Pleasant  Thought Process  Coherency WDL  Content WDL  Delusions None reported or observed  Perception Derealization  Hallucination None reported or observed  Judgment Impaired  Confusion None  Danger to Self  Current suicidal ideation? Denies  Agreement Not to Harm Self Yes  Description of Agreement verbal  Danger to Others  Danger to Others None reported or observed

## 2023-03-26 NOTE — Plan of Care (Signed)
  Problem: Coping: Goal: Ability to verbalize frustrations and anger appropriately will improve Outcome: Progressing   Problem: Coping: Goal: Ability to demonstrate self-control will improve Outcome: Progressing   Problem: Safety: Goal: Periods of time without injury will increase Outcome: Progressing   
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
## 2023-03-26 NOTE — Group Note (Signed)
Date:  03/26/2023 Time:  8:54 PM  Group Topic/Focus:  Wrap-Up Group:   The focus of this group is to help patients review their daily goal of treatment and discuss progress on daily workbooks.    Participation Level:  Active  Participation Quality:  Appropriate  Affect:  Appropriate  Cognitive:  Appropriate  Insight: Appropriate  Engagement in Group:  Engaged  Modes of Intervention:  Education and Exploration  Additional Comments:  Patient attended and participated in group tonight. She reports having a good day. She stated that people should say what they mean, and mean what they said. There should be better communication.  Lita Mains [facility] 03/26/2023, 8:54 PM

## 2023-03-26 NOTE — Plan of Care (Signed)
  Problem: Education: Goal: Knowledge of [facility] Education information/materials will improve Outcome: Progressing Goal: Emotional status will improve Outcome: Progressing Goal: Mental status will improve Outcome: Progressing Goal: Verbalization of understanding the information provided will improve Outcome: Progressing   

## 2023-03-27 LAB — CERULOPLASMIN: Ceruloplasmin: 17 mg/dL — ABNORMAL LOW (ref 19.0–39.0)

## 2023-03-27 NOTE — Plan of Care (Signed)
  Problem: Education: Goal: Knowledge of [facility] Education information/materials will improve Outcome: Progressing   Problem: Activity: Goal: Interest or engagement in activities will improve Outcome: Progressing   Problem: Coping: Goal: Ability to verbalize frustrations and anger appropriately will improve Outcome: Progressing   Problem: Health Behavior/Discharge Planning: Goal: Identification of resources available to assist in meeting health care needs will improve Outcome: Progressing

## 2023-03-27 NOTE — Group Note (Signed)
Date:  03/27/2023 Time:  8:51 PM  Group Topic/Focus:  Wrap-Up Group:   The focus of this group is to help patients review their daily goal of treatment and discuss progress on daily workbooks.    Participation Level:  Active  Participation Quality:  Appropriate  Affect:  Appropriate  Cognitive:  Appropriate  Insight: Appropriate  Engagement in Group:  Improving  Modes of Intervention:  Discussion  Additional Comments:  Pt stated her goal for today was to focus on her treatment plan. Pt stated she accomplished her goal today. Pt stated she talked with her doctor and with her social worker about her care today. Pt rated her overall day a 9 out of 10. Pt stated she was able to contact her mother and her girlfriend coming for visitation tonight improved her over all day. Pt stated she felt better about herself tonight. Pt stated she was able to attend all meals today. Pt stated she took all medications provided today. Pt stated her appetite was pretty good today. Pt rated her sleep last night was amazing. Pt stated the goal tonight was to get some rest. Pt stated she had no physical pain tonight. Pt deny visual hallucinations and auditory issues tonight. Pt denies thoughts of harming herself or others. Pt stated she would alert staff if anything changed.  Felipa Furnace 03/27/2023, 8:51 PM

## 2023-03-27 NOTE — Group Note (Signed)
Recreation Therapy Group Note   Group Topic:Stress Management  Group Date: 03/27/2023 Start Time: 1015 End Time: 1055 Facilitators: Nayara Taplin-McCall, LRT,CTRS Location: [address] Dayroom   Goal Area(s) Addresses:  Patient will identify positive stress management techniques. Patient will identify benefits of using stress management post d/c.     Group Description: Stress Management. LRT and patients discussed the importance of stress management.  Patients were to complete a worksheet that describing their largest source of stress in detail. Patient then needed to list two stressors they are currently experiencing and then circle the symptoms they experience in response to their stress. LRT also played music as patients completed the worksheet.   Affect/Mood: Appropriate   Participation Level: Engaged   Participation Quality: Independent   Behavior: Appropriate   Speech/Thought Process: Focused   Insight: Good   Judgement: Good   Modes of Intervention: Music and Worksheet   Patient Response to Interventions:  Engaged   Education Outcome:  Acknowledges education   Clinical Observations/Individualized Feedback: Pt was bright and engaged. Pt was attentive and focused to subject matter. Pt identified largest source of stress as getting a driver's license, getting her lights turned on and paying her bills. Pt also identified her family and church as her other stressors. Pt explained worry, sleep difficulties and over/under eating as symptoms she experiences as responses to stress. Pt went on to explain she could use her social support to help ease stressors by "communicating and talking with others about our needs/wants".     Plan: Continue to engage patient in RT group sessions 2-3x/week.   Callahan Peddie-McCall, LRT,CTRS 03/27/2023 12:07 PM

## 2023-03-27 NOTE — Plan of Care (Signed)
  Problem: Education: Goal: Mental status will improve Outcome: Progressing   Problem: Activity: Goal: Interest or engagement in activities will improve Outcome: Progressing Goal: Sleeping patterns will improve Outcome: Progressing   Problem: Coping: Goal: Ability to demonstrate self-control will improve Outcome: Progressing   Problem: Safety: Goal: Periods of time without injury will increase Outcome: Progressing

## 2023-03-27 NOTE — Plan of Care (Signed)

## 2023-03-27 NOTE — Group Note (Signed)
Date:  03/27/2023 Time:  6:13 PM  Group Topic/Focus:  Goals Group:   The focus of this group is to help patients establish daily goals to achieve during treatment and discuss how the patient can incorporate goal setting into their daily lives to aide in recovery. Orientation:   The focus of this group is to educate the patient on the purpose and policies of crisis stabilization and provide a format to answer questions about their admission.  The group details unit policies and expectations of patients while admitted.    Participation Level:  Active  Participation Quality:  Attentive  Affect:  Appropriate  Cognitive:  Appropriate  Insight: Appropriate  Engagement in Group:  Engaged  Modes of Intervention:  Discussion  Additional Comments:  Patient attended group and was attentive the duration of it. Patient's goal was to attend all groups.   Malin Cervini T Lorraine Lax 03/27/2023, 6:13 PM

## 2023-03-27 NOTE — Group Note (Signed)
LCSW Group Therapy Note   Group Date: 03/27/2023 Start Time: 1300 End Time: 1400  Type of Therapy and Topic:  Group Therapy:  Setting Goals  Participation Level:  Active    Description of Group: In this process group, patients discussed using strengths to work toward goals and address challenges.  Patients identified two positive things about themselves and one goal they were working on.  Patients were given the opportunity to share openly and support each other's plan for self-empowerment.  The group discussed the value of gratitude and were encouraged to have a daily reflection of positive characteristics or circumstances.  Patients were encouraged to identify a plan to utilize their strengths to work on current challenges and goals.   Therapeutic Goals Patient will verbalize personal strengths/positive qualities and relate how these can assist with achieving desired personal goals Patients will verbalize affirmation of peers plans for personal change and goal setting Patients will explore the value of gratitude and positive focus as related to successful achievement of goals Patients will verbalize a plan for regular reinforcement of personal positive qualities and circumstances.   Summary of Patient Progress: Patient identified the definition of goals. Patients was given the opportunity to share openly and support other group members' plan for self-empowerment. Patient verbalized personal strength and how they relate to achieving the desired goal. Patient in group was moved her chair away from one of her peers due to him not accepting something from another peer. Patient later on began speaking indirectly towards that peer and saying inappropriate comments such as " my goal is to stop the racism going on in here ". Patient remain in group the entire session.      Therapeutic Modalities Cognitive Behavioral Therapy Motivational Interviewing    Beather Arbour 03/27/2023  2:56  PM
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
## 2023-03-27 NOTE — Progress Notes (Signed)
   03/27/23 0800  Charting Type  Charting Type Shift assessment  Safety Check Verification  Has the RN verified the 15 minute safety check completion? Yes  Neurological  Neuro (WDL) WDL  HEENT  HEENT (WDL) WDL  Respiratory  Respiratory (WDL) WDL  Cardiac  Cardiac (WDL) WDL  Vascular  Vascular (WDL) WDL  Integumentary  Integumentary (WDL) X  Skin Turgor Non-tenting  Braden Scale (Ages 8 and up)  Sensory Perceptions 4  Moisture 4  Activity 4  Mobility 4  Nutrition 3  Friction and Shear 3  Braden Scale Score 22  Musculoskeletal  Musculoskeletal (WDL) WDL  Assistive Device None  Gastrointestinal  Gastrointestinal (WDL) WDL  GU Assessment  Genitourinary (WDL) WDL  Neurological  Level of Consciousness Alert
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
## 2023-03-28 NOTE — Progress Notes (Signed)
   03/28/23 0800  Psych Admission Type (Psych Patients Only)  Admission Status Involuntary  Psychosocial Assessment  Patient Complaints Worrying  Eye Contact Fair  Facial Expression Animated  Affect Anxious  Speech Logical/coherent  Interaction Assertive  Motor Activity Restless  Appearance/Hygiene Unremarkable  Behavior Characteristics Cooperative  Mood Pleasant;Anxious  Aggressive Behavior  Effect No apparent injury  Thought Process  Coherency WDL  Content WDL  Delusions WDL;Persecutory  Perception Derealization  Hallucination None reported or observed  Judgment Impaired  Confusion WDL  Danger to Self  Current suicidal ideation? Denies  Agreement Not to Harm Self Yes  Description of Agreement Verbal  Danger to Others  Danger to Others None reported or observed

## 2023-03-28 NOTE — Group Note (Signed)
Recreation Therapy Group Note   Group Topic:Leisure Education  Group Date: 03/28/2023 Start Time: 1050 End Time: 1130 Facilitators: Devanee Pomplun-McCall, LRT,CTRS Location: [address] Day[address]   Goal Area(s) Addresses:  Patient will successfully identify benefits of leisure participation. Patient will successfully identify ways to access leisure activities. Patient will successfully identify leisure activities.  Group Description: Leisure Science writer. Patients were to group up in groups of 2-4 and create their ideal recreation/community center. Patients were to create a name of the facility, identify hours of operation, what activities will be offered, age group programs are geared to and how often programs will be offered.   Affect/Mood: Appropriate   Participation Level: Engaged   Participation Quality: Independent   Behavior: Appropriate   Speech/Thought Process: Focused   Insight: Good   Judgement: Good   Modes of Intervention: Group work   Patient Response to Interventions:  Engaged   Education Outcome:  Acknowledges education   Clinical Observations/Individualized Feedback: Pt was focused and worked well with peers in completing activity. Two of the areas pt and peers came up with were spiritual assistance and guidance for success. Pt was engaged with peers and shared ideas throughout activity. Pt was appropriate during group session.    Plan: Continue to engage patient in RT group sessions 2-3x/week.   Axie Hayne-McCall, LRT,CTRS 03/28/2023 12:24 PM

## 2023-03-28 NOTE — [facility] Suicide Risk Assessment (Signed)
[facility] INPATIENT:  Family/Significant Other Suicide Prevention Education  Suicide Prevention Education:  Education Completed; 03-28-2023,  -[name] (Mother) [phone] been identified by the patient as the family member ( mother)with whom the patient will be residing, and identified as the person(s) who will aid the patient in the event of a mental health crisis (suicidal ideations/suicide attempt).  With written consent from the patient, the family member/significant other has been provided the following suicide prevention education, prior to the and/or following the discharge of the patient.  The suicide prevention education provided includes the following: Suicide risk factors Suicide prevention and interventions National Suicide Hotline telephone number [facility] assessment telephone number [facility] Emergency Assistance 911 [facility] and/or Residential Mobile Crisis Unit telephone number  Request made of family/significant other to: Remove weapons (e.g., guns, rifles, knives), all items previously/currently identified as safety concern.   Remove drugs/medications (over-the-counter, prescriptions, illicit drugs), all items previously/currently identified as a safety concern.  -[name] (Mother) [phone] verbalizes understanding of the suicide prevention education information provided.  The family member/significant other agrees to remove the items of safety concern listed above.  Kathleen Lucas S Yates Weisgerber 03/28/2023, 2:33 PM

## 2023-03-28 NOTE — Progress Notes (Signed)
Adult Psychoeducational Group Note  Date:  03/28/2023 Time:  10:10 AM  Group Topic/Focus:  Goals Group:   The focus of this group is to help patients establish daily goals to achieve during treatment and discuss how the patient can incorporate goal setting into their daily lives to aide in recovery.  Participation Level:  Active  Participation Quality:  Appropriate  Affect:  Appropriate  Cognitive:  Appropriate  Insight: Appropriate  Engagement in Group:  Engaged  Modes of Intervention:  Discussion  Additional Comments: The patient engaged in group.  Octavio Manns 03/28/2023, 10:10 AM

## 2023-03-28 NOTE — Plan of Care (Signed)

## 2023-03-29 ENCOUNTER — Encounter (HOSPITAL_COMMUNITY): Payer: Self-pay

## 2023-03-29 MED ORDER — RISPERIDONE 2 MG PO TBDP
2.0000 mg | ORAL_TABLET | Freq: Two times a day (BID) | ORAL | Status: DC
  Administered 2023-03-29 – 2023-03-30 (×2): 2 mg via ORAL
  Filled 2023-03-29 (×4): qty 1

## 2023-03-29 MED ORDER — RISPERIDONE 2 MG PO TABS: 2.0000 mg | ORAL_TABLET | Freq: Two times a day (BID) | ORAL | Status: DC

## 2023-03-29 NOTE — [facility] Group Notes (Signed)
[facility] Group Notes:  (Nursing/MHT/Case Management/Adjunct)  Date:  03/29/2023  Time:  10:04 AM  Type of Therapy: Group Topic/ Focus: Goals Group: The focus of this group is to help patients establish daily goals to achieve during treatment and discuss how the patient can incorporate goal setting into their daily lives to aide in recovery.   Participation Level:  Minimal  Participation Quality:  Inattentive  Affect:  Appropriate  Cognitive:  Appropriate  Insight:  Lacking  Engagement in Group:  Lacking  Modes of Intervention:  Discussion  Summary of Progress/Problems:  Patient attended orientation/ goals group today.   Osvaldo Human R Andromeda Poppen 03/29/2023, 10:04 AM

## 2023-03-29 NOTE — Progress Notes (Addendum)
Patient denies Si, HI and AVH. Rates anxiety 1 or 2 and depression 0 out of 10. Patient reports good sleep this morning and denies any paranoia that people are watching her or out to get her. Patient presents with some suspicion about the shape of risperidone tablet and that medication is making her drowsy. Patient observed to be getting irritable on the phone with someone. Patient slammed the phone down and became upset about not being able to get a bra or her glasses from the people she relies on for resources. Patient reported anxiety in the afternoon and refers to feeling sorry for the "other people." Patient refused PRN for anxiety. Patient remains safe with Q 15 min safety checks ongoing.   03/29/23 0800  Psychosocial Assessment  Patient Complaints Suspiciousness;Tension  Eye Contact Fair  Facial Expression Animated  Affect Appropriate to circumstance  Speech Logical/coherent  Interaction Assertive  Motor Activity Restless  Appearance/Hygiene Unremarkable  Behavior Characteristics Cooperative;Appropriate to situation  Mood Preoccupied;Pleasant  Thought Process  Coherency WDL  Content Paranoia  Delusions Persecutory  Perception Derealization  Hallucination None reported or observed  Judgment Limited  Confusion None  Danger to Self  Current suicidal ideation? Denies  Agreement Not to Harm Self Yes  Danger to Others  Danger to Others None reported or observed

## 2023-03-29 NOTE — Group Note (Signed)
Recreation Therapy Group Note   Group Topic:Other  Group Date: 03/29/2023 Start Time: 4742 End Time: 1015 Facilitators: Naama Sappington-McCall, LRT,CTRS Location: [address] Day[address]   Goal Area(s) Addresses:  Patient will identify positive leisure and recreation activities.  Patient will identify one positive benefit of participation in leisure activities.    Group Description: Music Trivia. Patients were divided in to 2 groups for game play. LRT read song lyrics from songs in the  1990s and 2000s hip hop and R&B from a playing card. Each team takes turns answering the question. If they are unable to answer the question, the other team gets the chance to steal the point. The team with the most points wins the game.    Affect/Mood: N/A   Participation Level: Did not attend    Clinical Observations/Individualized Feedback:     Plan: Continue to engage patient in RT group sessions 2-3x/week.   Kerianna Rawlinson-McCall, LRT,CTRS 03/29/2023 12:26 PM

## 2023-03-29 NOTE — Progress Notes (Signed)
   03/28/23 2000  Psychosocial Assessment  Patient Complaints Anger;Anxiety;Irritability  Eye Contact Brief  Facial Expression Anxious;Animated (Smiles inappropriately)  Affect Anxious;Labile;Depressed;Angry;Irritable  Speech Logical/coherent  Interaction Assertive  Motor Activity Fidgety;Restless  Appearance/Hygiene Disheveled  Behavior Characteristics Anxious;Irritable;Fidgety;Aggressive physically;Agitated (angry at one point.Pushed book cart in to wall and says,"For good reason")  Mood Labile;Depressed;Anxious;Pleasant;Suspicious;Irritable  Aggressive Behavior  Targets Property  Type of Behavior Other (Comment) (shoved book cart angrily against wall)  Effect No apparent injury  Thought Process  Coherency Disorganized  Content Blaming others;Delusions (Possible delusions/Reports angry about people suggesting things to her through her dreams/Felt was being watched sleeeping by another pt.)  Hallucination None reported or observed  Judgment Limited  Confusion Mild  Danger to Self  Current suicidal ideation? Denies  Agreement Not to Harm Self Yes  Danger to Others  Danger to Others None reported or observed
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
## 2023-03-29 NOTE — Progress Notes (Signed)
Patient left dayroom after getting upset and verbally addressing other patients sitting and talking together at the center table. Pt is angry and accuses other patients of talking about her. Patients tell her she is mistaken and repeated what they said and whom they were speaking about. This Clinical research associate did not hear anything derogatory or directed toward the patient. Pt came back into dayroom and was argumentative. Showing signs of paranoia.  Said she was going to watch the movie. Pt was encouraged to stay and watch the movie but refrain from making comments. Pt was recently moved from [address] and may not be comfortable in the milieu.

## 2023-03-29 NOTE — Discharge Instructions (Signed)
Dear Kathleen Lucas,  It was a pleasure to take care of you during your stay at [facility] where you were treated for your Bipolar I disorder, most recent episode (or current) manic (HCC).  While you were here, you were:  observed and cared for by our nurses and nursing assistants  treated with medications by your psychiatrists  evaluated with imaging / lab tests, and treated with medicines / procedures by your doctors  provided individual and group therapy by therapists  provided resources by our social workers and case managers  Please review the medication list provided to you at discharge and stop, start taking, or continue taking the medications listed there.  You should also follow-up with your primary care doctor, or start seeing one if you don't have one yet. If applicable, here are some scheduled follow-ups for you:  Follow-up Information     Services, [facility]. Go on 03/31/2023.   Why: You have a hospital follow up appointment for therapy and medication management services on 03/31/23 at 10:00 am.  This appointment will be held in person. Contact information: [address] [place] [address] [phone]                  I recommend abstinence from alcohol, tobacco, and other illicit drug use.   If your psychiatric symptoms or suicidal thoughts recur, worsen, or if you have side effects to your psychiatric medications, call your outpatient psychiatric provider, 911, [phone] or go to the nearest emergency department.  Take care!  Signed: Augusto Gamble, MD 03/29/2023, 7:50 AM  Naloxone (Narcan) can help reverse an overdose when given to the victim quickly.  [place] offers free naloxone kits and instructions/training on its use.  Add naloxone to your first aid kit and you can help save a life. A prescription can be filled at your local pharmacy or free kits are provided by the county.  Pick up your free kit at the following locations:    [place]:  [facility], [address] [place] [address] [phone]) [facility] [address] [place] [id number] [phone]) [facility] [address] [place] [address]  [place]: [facility] [address] [place] [address] ([phone]) [facility] [address] [place] [address] [phone])

## 2023-03-29 NOTE — [facility] Group Notes (Signed)
Spiritual care group on grief and loss facilitated by Chaplain Dyanne Carrel, [facility]  Group Goal: Support / Education around grief and loss  Members engage in facilitated group support and psycho-social education.  Group Description:  Following introductions and group rules, group members engaged in facilitated group dialogue and support around topic of loss, with particular support around experiences of loss in their lives. Group Identified types of loss (relationships / self / things) and identified patterns, circumstances, and changes that precipitate losses. Reflected on thoughts / feelings around loss, normalized grief responses, and recognized variety in grief experience. Group encouraged individual reflection on safe space and on the coping skills that they are already utilizing.  Group drew on Adlerian / Rogerian and narrative framework  Patient Progress: Kathleen Lucas attended group.  She asked pointed questions and became angry and wanted answers to her questions.  She did this several times before stating that she felt condescended to and left the group saying, "Fine. You all can't help me!"

## 2023-03-29 NOTE — BH IP Treatment Plan (Signed)
Interdisciplinary Treatment and Diagnostic Plan Update  03/29/2023 Time of Session: [address] [place] MRN: [id number]  Principal Diagnosis: Bipolar I disorder, most recent episode (or current) manic (HCC)  Secondary Diagnoses: Principal Problem:   Bipolar I disorder, most recent episode (or current) manic (HCC) Active Problems:   Unspecified psychotic disorder   PTSD (post-traumatic stress disorder)   Panic attacks   Cannabis use disorder   Current Medications:  Current Facility-Administered Medications  Medication Dose Route Frequency Provider Last Rate Last Admin   acetaminophen (TYLENOL) tablet 650 mg  650 mg Oral Q6H PRN Augusto Gamble, MD       alum & mag hydroxide-simeth (MAALOX/MYLANTA) 200-200-20 MG/5ML suspension 30 mL  30 mL Oral Q4H PRN Augusto Gamble, MD       bismuth subsalicylate (PEPTO BISMOL) chewable tablet 524 mg  524 mg Oral Q3H PRN Augusto Gamble, MD       calamine lotion 1 Application  1 Application Topical PRN Lance Muss, MD   1 Application at 03/27/23 2026   diphenhydrAMINE (BENADRYL) capsule 50 mg  50 mg Oral TID PRN Jearld Lesch, NP       Or   diphenhydrAMINE (BENADRYL) injection 50 mg  50 mg Intramuscular TID PRN Jearld Lesch, NP       haloperidol (HALDOL) tablet 5 mg  5 mg Oral TID PRN Jearld Lesch, NP       Or   haloperidol lactate (HALDOL) injection 5 mg  5 mg Intramuscular TID PRN Jearld Lesch, NP       hydrOXYzine (ATARAX) tablet 25 mg  25 mg Oral TID PRN Augusto Gamble, MD   25 mg at 03/28/23 2041   LORazepam (ATIVAN) tablet 2 mg  2 mg Oral TID PRN Jearld Lesch, NP       Or   LORazepam (ATIVAN) injection 2 mg  2 mg Intramuscular TID PRN Jearld Lesch, NP       ondansetron (ZOFRAN) tablet 8 mg  8 mg Oral Q8H PRN Augusto Gamble, MD       polyethylene glycol (MIRALAX / GLYCOLAX) packet 17 g  17 g Oral Daily PRN Augusto Gamble, MD   17 g at 03/27/23 1478   risperiDONE (RISPERDAL M-TABS) disintegrating tablet 2 mg  2 mg Oral Q12H Augusto Gamble,  MD       senna Mancel Parsons) tablet 8.6 mg  1 tablet Oral QHS PRN Augusto Gamble, MD       traZODone (DESYREL) tablet 50 mg  50 mg Oral QHS PRN Jearld Lesch, NP   50 mg at 03/28/23 2041   PTA Medications: Medications Prior to Admission  Medication Sig Dispense Refill Last Dose   albuterol (PROVENTIL HFA;VENTOLIN HFA) 108 (90 Base) MCG/ACT inhaler Inhale 1-2 puffs into the lungs every 6 (six) hours as needed for wheezing or shortness of breath. 1 Inhaler 0    [EXPIRED] amoxicillin-clavulanate (AUGMENTIN) 875-125 MG tablet Take 1 tablet by mouth 2 (two) times daily for 7 days. 14 tablet 0     Patient Stressors: Marital or family conflict   Medication change or noncompliance    Patient Strengths: Automotive engineer for treatment/growth   Treatment Modalities: Medication Management, Group therapy, Case management,  1 to 1 session with clinician, Psychoeducation, Recreational therapy.   Physician Treatment Plan for Primary Diagnosis: Bipolar I disorder, most recent episode (or current) manic (HCC) [place] Term Goal(s):     Short Term Goals:    Medication  Management: Evaluate patient's response, side effects, and tolerance of medication regimen.  Therapeutic Interventions: 1 to 1 sessions, Unit Group sessions and Medication administration.  Evaluation of Outcomes: Progressing  Physician Treatment Plan for Secondary Diagnosis: Principal Problem:   Bipolar I disorder, most recent episode (or current) manic (HCC) Active Problems:   Unspecified psychotic disorder   PTSD (post-traumatic stress disorder)   Panic attacks   Cannabis use disorder  [place] Term Goal(s):     Short Term Goals:       Medication Management: Evaluate patient's response, side effects, and tolerance of medication regimen.  Therapeutic Interventions: 1 to 1 sessions, Unit Group sessions and Medication administration.  Evaluation of Outcomes: Progressing   RN Treatment Plan for Primary Diagnosis:  Bipolar I disorder, most recent episode (or current) manic (HCC) [place] Term Goal(s): Knowledge of disease and therapeutic regimen to maintain health will improve  Short Term Goals: Ability to remain free from injury will improve, Ability to verbalize frustration and anger appropriately will improve, Ability to demonstrate self-control, Ability to participate in decision making will improve, Ability to verbalize feelings will improve, Ability to disclose and discuss suicidal ideas, Ability to identify and develop effective coping behaviors will improve, and Compliance with prescribed medications will improve  Medication Management: RN will administer medications as ordered by provider, will assess and evaluate patient's response and provide education to patient for prescribed medication. RN will report any adverse and/or side effects to prescribing provider.  Therapeutic Interventions: 1 on 1 counseling sessions, Psychoeducation, Medication administration, Evaluate responses to treatment, Monitor vital signs and CBGs as ordered, Perform/monitor CIWA, COWS, AIMS and Fall Risk screenings as ordered, Perform wound care treatments as ordered.  Evaluation of Outcomes: Progressing   LCSW Treatment Plan for Primary Diagnosis: Bipolar I disorder, most recent episode (or current) manic (HCC) [place] Term Goal(s): Safe transition to appropriate next level of care at discharge, Engage patient in therapeutic group addressing interpersonal concerns.  Short Term Goals: Engage patient in aftercare planning with referrals and resources, Increase social support, Increase ability to appropriately verbalize feelings, Increase emotional regulation, Facilitate acceptance of mental health diagnosis and concerns, Facilitate patient progression through stages of change regarding substance use diagnoses and concerns, Identify triggers associated with mental health/substance abuse issues, and Increase skills for wellness and  recovery  Therapeutic Interventions: Assess for all discharge needs, 1 to 1 time with Social worker, Explore available resources and support systems, Assess for adequacy in community support network, Educate family and significant other(s) on suicide prevention, Complete Psychosocial Assessment, Interpersonal group therapy.  Evaluation of Outcomes: Progressing   Progress in Treatment: Attending groups: Yes. Participating in groups: Yes. Taking medication as prescribed: Yes. Toleration medication: No. Family/Significant other contact made: Yes, individual(s) contacted:  -[name] (Mother) Patient understands diagnosis: Yes. Discussing patient identified problems/goals with staff: Yes. Medical problems stabilized or resolved: Yes. Denies suicidal/homicidal ideation: Yes. Issues/concerns per patient self-inventory: Yes. Other: N/A  New problem(s) identified: No, Describe:  None reported  New Short Term/[place] Term Goal(s): medication stabilization, elimination of SI thoughts, development of comprehensive mental wellness plan.   Patient Goals:  Medication Stabilization  Discharge Plan or Barriers: CSW will continue to follow and assess for appropriate referrals and possible discharge planning.   Reason for Continuation of Hospitalization: Mania Medication stabilization Withdrawal symptoms  Estimated Length of Stay: 3-7 Days  Last 3 [place] Suicide Severity Risk Score: Flowsheet Row Ad[facility] (Current) [facility]21/2023 in [facility]  Emergency Department at [facility] ED from [facility]19/2023 in [facility] Department [facility]  C-SSRS RISK CATEGORY Low Risk Low Risk No Risk       Last PHQ 2/9 Scores:     No data to display         detox, medication management for mood stabilization; elimination of SI thoughts; development of comprehensive mental wellness/sobriety plan  Scribe for  Treatment Team: Ane Payment, LCSW 03/29/2023 2:56 PM

## 2023-03-29 NOTE — [facility] Group Notes (Signed)
[facility] Group Notes:  (Nursing/MHT/Case Management/Adjunct)  Date:  03/29/2023  Time:  2000 Type of Therapy:   Narcotics Anonymous Meeting  Participation Level:  Active  Participation Quality:  Attentive, Intrusive, Redirectable, and Sharing  Affect:  Appropriate  Cognitive:  Lacking  Insight:  Limited  Engagement in Group:  Engaged  Modes of Intervention:  Education and Support  Summary of Progress/Problems:  Marcille Buffy 03/29/2023, 9:38 PM

## 2023-03-30 DIAGNOSIS — F3110 Bipolar disorder, current episode manic without psychotic features, unspecified: Secondary | ICD-10-CM

## 2023-03-30 MED ORDER — TRAZODONE HCL 50 MG PO TABS: 50.0000 mg | ORAL_TABLET | Freq: Every evening | ORAL | 0 refills | Status: AC | PRN

## 2023-03-30 MED ORDER — HYDROXYZINE HCL 25 MG PO TABS: 25.0000 mg | ORAL_TABLET | Freq: Three times a day (TID) | ORAL | 0 refills | Status: AC | PRN

## 2023-03-30 MED ORDER — RISPERIDONE 4 MG PO TABS: 4.0000 mg | ORAL_TABLET | Freq: Every day | ORAL | 0 refills | Status: AC

## 2023-03-30 MED ORDER — RISPERIDONE 2 MG PO TABS: 4.0000 mg | ORAL_TABLET | Freq: Every day | ORAL | Status: DC

## 2023-03-30 NOTE — Plan of Care (Signed)
  Problem: Activity: Goal: Sleeping patterns will improve Outcome: Progressing   Problem: Coping: Goal: Ability to verbalize frustrations and anger appropriately will improve Outcome: Not Progressing Goal: Ability to demonstrate self-control will improve Outcome: Not Progressing  Patient stated she had a rough day today stated " I was trying to talk to my family to bring me some items by they were not understanding me. I  am tired of my family not doing what I want them to do for me. Patient required encouragement with medications endorses being very Suspicious and thinking Her Peers are talking about her. Patient endorses anxiety has been tearful this shift Prn Vistaril given and Trazodone for sleep. Patient required lots of redirections during group with Peers.  Support and encouragement provided. Q 15 minutes safety checks ongoing and Patient remains safe.

## 2023-03-30 NOTE — [facility]HH Suicide Risk Assessment (Addendum)
Suicide Risk Assessment  Discharge Assessment    [facility] Discharge Suicide Risk Assessment   Principal Problem: Bipolar I disorder, most recent episode (or current) manic (HCC) Discharge Diagnoses: Principal Problem:   Bipolar I disorder, most recent episode (or current) manic (HCC) Active Problems:   Unspecified psychotic disorder   PTSD (post-traumatic stress disorder)   Panic attacks   Cannabis use disorder   Total Time spent with patient: 15 minutes  Kathleen Lucas is a 27 y.o., female with no past psychiatric history who presents to the [facility] under IVC from [facility] for evaluation and management of abnormal behavior and concerns about danger to others.   During the patient's hospitalization, patient had extensive initial psychiatric evaluation, and follow-up psychiatric evaluations every day.  Psychiatric diagnoses provided upon initial assessment:  Unspecified psychotic disorder, r/o schizophrenia spectrum disorders, r/o med/substance induced psychotic disorder PTSD Panic attacks Cannabis use disorder R/o MDD vs bipolar disorder vs med/substance induced mood disorder   Patient's psychiatric medications were adjusted on admission:  Patient was initially started on risperidone 1 mg once, then titrated up to 1 mg Q12Hrs for treatment of psychosis  During the hospitalization, other adjustments were made to the patient's psychiatric medication regimen:  Continued to increase risperidone up to 2 mg Q12Hrs; changed to disintegrating tablet for concerns of cheeking By day of discharge, patient's regimen was modified to Risperdal 4 mg QHS  Patient's care was discussed during the interdisciplinary team meeting every day during the hospitalization.  The patient denies having side effects to prescribed psychiatric medication.  Gradually, patient started adjusting to milieu. The patient was evaluated each day by a clinical provider to ascertain response to treatment.  Improvement was noted by the patient's report of decreasing symptoms, improved sleep and appetite, affect, medication tolerance, behavior, and participation in unit programming.  Patient was asked each day to complete a self inventory noting mood, mental status, pain, new symptoms, anxiety and concerns.    Symptoms were reported as significantly decreased or resolved completely by discharge.   On day of discharge, the patient reports that their mood is stable. The patient denied having suicidal thoughts for more than 48 hours prior to discharge.  Patient denies having homicidal thoughts.  Patient denies having auditory hallucinations.  Patient denies any visual hallucinations or other symptoms of psychosis. The patient was motivated to continue taking medication with a goal of continued improvement in mental health.   The patient reports their target psychiatric symptoms of psychosis responded well to the psychiatric medications, and the patient reports overall benefit other psychiatric hospitalization. Supportive psychotherapy was provided to the patient. The patient also participated in regular group therapy while hospitalized. Coping skills, problem solving as well as relaxation therapies were also part of the unit programming.  Labs were reviewed with the patient, and abnormal results were discussed with the patient.  The patient is able to verbalize their individual safety plan to this provider.  # It is recommended to the patient to continue psychiatric medications as prescribed, after discharge from the hospital.    # It is recommended to the patient to follow up with your outpatient psychiatric provider and PCP.  # It was discussed with the patient, the impact of alcohol, drugs, tobacco have been there overall psychiatric and medical wellbeing, and total abstinence from substance use was recommended the patient.ed.  # Prescriptions provided or sent directly to preferred pharmacy at  discharge. Patient agreeable to plan. Given opportunity to ask questions. Appears to  feel comfortable with discharge.    # In the event of worsening symptoms, the patient is instructed to call the crisis hotline, 911 and or go to the nearest ED for appropriate evaluation and treatment of symptoms. To follow-up with primary care provider for other medical issues, concerns and or health care needs  # Patient was discharged Home with mother with a plan to follow up as noted below.    Musculoskeletal: Strength & Muscle Tone: within normal limits Gait & Station: normal Patient leans: N/A  Psychiatric Specialty Exam:  Presentation  General Appearance:  Appropriate for Environment; Casual; Fairly Groomed  Eye Contact: Fair  Speech: Clear and Coherent; Normal Rate  Speech Volume: Normal  Handedness: Right   Mood and Affect  Mood: Euthymic  Affect: Appropriate; Full Range; Congruent   Thought Process  Thought Processes: Coherent; Goal Directed; Linear  Descriptions of Associations:Intact  Orientation:Full (Time, Place and Person)  Thought Content:Logical; WDL  History of Schizophrenia/Schizoaffective disorder:No  Duration of Psychotic Symptoms:N/A  Hallucinations:Hallucinations: None  Ideas of Reference:None  Suicidal Thoughts:Suicidal Thoughts: No  Homicidal Thoughts:Homicidal Thoughts: No   Sensorium  Memory: Immediate Fair  Judgment: Fair  Insight: Fair   Executive Functions  Concentration: Good  Attention Span: Good  Recall: Good  Fund of Knowledge: Good  Language: Good   Psychomotor Activity  Psychomotor Activity: Psychomotor Activity: Normal   Assets  Assets: Communication Skills; Desire for Improvement; Resilience   Sleep  Sleep: Sleep: Good    Physical Exam: Physical Exam Vitals and nursing note reviewed.  Constitutional:      General: She is not in acute distress.    Appearance: She is not ill-appearing.   HENT:     Head: Normocephalic and atraumatic.  Pulmonary:     Effort: Pulmonary effort is normal. No respiratory distress.  Musculoskeletal:        General: Normal range of motion.  Neurological:     General: No focal deficit present.     Mental Status: She is alert and oriented to person, place, and time.  Psychiatric:        Mood and Affect: Mood normal.        Behavior: Behavior normal.        Thought Content: Thought content normal.        Judgment: Judgment normal.    Review of Systems  Constitutional:  Negative for chills and fever.  Respiratory:  Negative for shortness of breath.   Cardiovascular:  Negative for chest pain.  Psychiatric/Behavioral:  Negative for depression, hallucinations, memory loss, substance abuse and suicidal ideas. The patient does not have insomnia.        Reports continued anxiety and mild paranoia, improved from admission.    Blood pressure 116/76, pulse (!) 110, temperature 98.1 F (36.7 C), temperature source Oral, resp. rate 16, height 5\' 2"  (1.575 m), weight 58.1 kg, last menstrual period 03/16/2023, SpO2 100%. Body mass index is 23.41 kg/m.  Mental Status Per Nursing Assessment::   On Admission:  NA  Demographic factors:  Caucasian, Living alone Current Mental Status:  NA Loss Factors:  Decrease in vocational status, Loss of significant relationship Historical Factors:  Impulsivity Risk Reduction Factors:  Employed   Continued Clinical Symptoms:  Bipolar Disorder: currently euthymic More than one psychiatric diagnosis  Cognitive Features That Contribute To Risk:  None    Suicide Risk:  Mild: There are no identifiable suicide plans, no associated intent, mild dysphoria and related symptoms, good self-control (both objective and subjective assessment),  few other risk factors, and identifiable protective factors, including available and accessible social support.    Follow-up Information     Services, [facility]. Go on  03/31/2023.   Why: You have a hospital follow up appointment for therapy and medication management services on 03/31/23 at 10:00 am.  This appointment will be held in person. Contact information: [address] [place] [address] [phone]                 Plan Of Care/Follow-up recommendations:  Activity: as tolerated  Diet: heart healthy  Other: -Follow-up with your outpatient psychiatric provider -instructions on appointment date, time, and address (location) are provided to you in discharge paperwork.  -Take your psychiatric medications as prescribed at discharge - instructions are provided to you in the discharge paperwork  -Follow-up with outpatient primary care doctor and other specialists -for management of preventative medicine and chronic medical disease, including: None  -Testing: Follow-up with outpatient provider for abnormal lab results:  Ceruloplasmin mildly decreased 17 First break psychosis workup negative to date. Per CT read, patient may benefit from MRI of head to further visualize cerebellar tonsils   -Recommend abstinence from alcohol, tobacco, and other illicit drug use at discharge.   -If your psychiatric symptoms recur, worsen, or if you have side effects to your psychiatric medications, call your outpatient psychiatric provider, 911, [phone] or go to the nearest emergency department.  -If suicidal thoughts recur, call your outpatient psychiatric provider, 911, [phone] or go to the nearest emergency department.     Lorri Frederick, MD 03/30/2023, 9:14 AM
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
## 2023-03-30 NOTE — Group Note (Signed)
Date:  03/30/2023 Time:  11:59 AM  Group Topic/Focus:  Goals Group:   The focus of this group is to help patients establish daily goals to achieve during treatment and discuss how the patient can incorporate goal setting into their daily lives to aide in recovery.    Participation Level:  Active  Participation Quality:  Appropriate  Affect:  Appropriate  Cognitive:  Alert  Insight: Appropriate  Engagement in Group:  Engaged  Modes of Intervention:  Discussion  Additional Comments:    Beckie Busing 03/30/2023, 11:59 AM

## 2023-03-30 NOTE — Progress Notes (Signed)
Discharge Note:  Patient discharged to lobby. Patient denied SI and HI. Denied A/V hallucinations. Suicide prevention information given and discussed with patient who stated they understood and had no questions. Patient stated they received all their belongings, clothing, toiletries, misc items, etc. Patient stated they appreciated all assistance received from [facility] staff. All required discharge information given to patient. 

## 2023-03-30 NOTE — Progress Notes (Signed)
  [facility] Adult Case Management Discharge Plan :  Will you be returning to the same living situation after discharge:  No. With Mother [name] [phone] At discharge, do you have transportation home?: Yes,  [phone] Do you have the ability to pay for your medications: Yes,  Insured  Release of information consent forms completed and in the chart;  Patient'Kathleen signature needed at discharge.  Patient to Follow up at:  Follow-up Information     [facility]. Go on 03/31/2023.   Why: You have a hospital follow up appointment for therapy and medication management services on 03/31/23 at 10:00 am.  This appointment will be held in person. Contact information: [address] [place] [address] [phone]                 Next level of care provider has access to [facility] Link:no  Safety Planning and Suicide Prevention discussed: Yes,  [phone]     Has patient been referred to the Quitline?: Patient refused referral for treatment  Patient has been referred for addiction treatment: Yes, the patient will follow up with an outpatient provider for substance use disorder. Psychiatrist/APP: appointment made and Therapist: appointment made Patient to continue working towards treatment goals after discharge. Patient no longer meets criteria for inpatient criteria per attending physician. Continue taking medications as prescribed, nursing to provide instructions at discharge. Follow up with all scheduled appointments.   Kathleen Lucas Kathleen Kyrah Schiro, LCSW 03/30/2023, 1:14 PM

## 2023-03-30 NOTE — Progress Notes (Signed)
  [facility] Adult Case Management Discharge Plan :  Will you be returning to the same living situation after discharge:  Yes,  Home At discharge, do you have transportation home?: Yes,  Mother Theodis Sato you have the ability to pay for your medications: Yes,     Release of information consent forms completed and in the chart;  Patient's signature needed at discharge.  Patient to Follow up at:  Follow-up Information     [facility], [facility]. Go on 03/31/2023.   Why: You have a hospital follow up appointment for therapy and medication management services on 03/31/23 at 10:00 am.  This appointment will be held in person. Contact information: [address] [place] [address] [phone]                 Next level of care provider has access to [facility] Link:no  Safety Planning and Suicide Prevention discussed: Yes,  [name] Mother     Has patient been referred to the Quitline?: Patient refused referral for treatment  Patient has been referred for addiction treatment: Patient refused referral for treatment.  Starleen Arms, LCSW 03/30/2023, 9:19 AM

## 2024-06-13 ENCOUNTER — Other Ambulatory Visit: Payer: Self-pay

## 2024-06-13 ENCOUNTER — Emergency Department (HOSPITAL_COMMUNITY)
Admission: EM | Admit: 2024-06-13 | Discharge: 2024-06-13 | Disposition: A | Discharge status: Home / Self Care | Payer: MEDICAID | Attending: Emergency Medicine | Admitting: Emergency Medicine

## 2024-06-13 ENCOUNTER — Encounter (HOSPITAL_COMMUNITY): Payer: Self-pay

## 2024-06-13 DIAGNOSIS — S0502XA Injury of conjunctiva and corneal abrasion without foreign body, left eye, initial encounter: Secondary | ICD-10-CM | POA: Insufficient documentation

## 2024-06-13 DIAGNOSIS — X58XXXA Exposure to other specified factors, initial encounter: Secondary | ICD-10-CM | POA: Insufficient documentation

## 2024-06-13 DIAGNOSIS — H5712 Ocular pain, left eye: Secondary | ICD-10-CM | POA: Diagnosis present

## 2024-06-13 MED ORDER — TETRACAINE HCL 0.5 % OP SOLN
2.0000 [drp] | Freq: Once | OPHTHALMIC | Status: AC
  Administered 2024-06-13: 2 [drp] via OPHTHALMIC
  Filled 2024-06-13: qty 4

## 2024-06-13 MED ORDER — TOBRAMYCIN 0.3 % OP SOLN
1.0000 [drp] | Freq: Once | OPHTHALMIC | Status: AC
  Administered 2024-06-13: 1 [drp] via OPHTHALMIC
  Filled 2024-06-13: qty 5

## 2024-06-13 MED ORDER — FLUORESCEIN SODIUM 1 MG OP STRP
1.0000 | ORAL_STRIP | Freq: Once | OPHTHALMIC | Status: AC
  Administered 2024-06-13: 1 via OPHTHALMIC
  Filled 2024-06-13: qty 1

## 2024-06-13 NOTE — ED Triage Notes (Signed)
 Patient came in POV with complaints of eye pain. Pt states that she got hot sauce in it last night, but did flush it out with eye drops. Pt states she woke up this morning with it still hurting. Eye is light sensitive and pain is a 7/10

## 2024-06-13 NOTE — Discharge Instructions (Signed)
 You may apply cool compresses or warm compresses on and off to your eye.  Avoid scratching your eye, you may want to wear sunglasses while outside for the next few days.  Apply 1 drop of the tobramycin to your left eye 4 times a day for 5 to 7 days.  May take Tylenol  or ibuprofen  if needed for discomfort.  Call the ophthalmologist listed to arrange follow-up appointment or if you may prefer to follow-up with a optometrist if you have one, to have your eye rechecked in 1 week.  Return to the emergency department for any new or worsening symptoms.

## 2024-06-13 NOTE — ED Provider Notes (Signed)
 [place] EMERGENCY DEPARTMENT AT [facility] Provider Note   CSN: [id number] Arrival date & time: 06/13/24  9191     Patient presents with: Eye Pain   Kathleen Lucas is a 28 y.o. female.    Eye Pain Pertinent negatives include no headaches.       Kathleen Lucas is a 28 y.o. female who presents to the Emergency Department complaining of foreign body sensation and burning to her left eye.  She states she accidentally got hot sauce in her left eye last evening.  She flushed her eye with tap water and applied over-the-counter Visine drops without improvement.  She feels as though something is in her eye when she blinks.  She notes excessive tearing and photophobia of the eye.  She denies any visual changes headache or dizziness.  No facial pain or swelling.  She wears corrective glasses but denies contact use  Prior to Admission medications   Medication Sig Start Date End Date Taking? Authorizing Provider  albuterol  (PROVENTIL  HFA;VENTOLIN  HFA) 108 (90 Base) MCG/ACT inhaler Inhale 1-2 puffs into the lungs every 6 (six) hours as needed for wheezing or shortness of breath. 05/12/16   Sofia, Leslie K, PA-C  hydrOXYzine  (ATARAX ) 25 MG tablet Take 1 tablet (25 mg total) by mouth 3 (three) times daily as needed for itching or anxiety. 03/30/23   Massengill, Rankin, MD  risperiDONE  (RISPERDAL ) 4 MG tablet Take 1 tablet (4 mg total) by mouth at bedtime. 03/31/23 04/30/23  Massengill, Rankin, MD  traZODone  (DESYREL ) 50 MG tablet Take 1 tablet (50 mg total) by mouth at bedtime as needed for sleep. 03/30/23 04/29/23  Johny Rankin, MD    Allergies: Fish allergy    Review of Systems  Constitutional:  Negative for chills and fever.  Eyes:  Positive for photophobia, pain and redness. Negative for discharge, itching and visual disturbance.  Gastrointestinal:  Negative for nausea and vomiting.  Neurological:  Negative for dizziness, facial asymmetry, weakness, numbness and headaches.     Updated Vital Signs BP 117/74   Pulse 71   Temp 98.5 F (36.9 C) (Oral)   Resp 17   Ht 5' 2 (1.575 m)   Wt 68 kg   SpO2 99%   BMI 27.44 kg/m   Physical Exam Vitals and nursing note reviewed.  Constitutional:      General: She is not in acute distress.    Appearance: Normal appearance. She is not ill-appearing.  Eyes:     General: Lids are normal. Lids are everted, no foreign bodies appreciated. Vision grossly intact. Gaze aligned appropriately.        Left eye: No foreign body, discharge or hordeolum.     Extraocular Movements: Extraocular movements intact.     Left eye: Normal extraocular motion.     Conjunctiva/sclera:     Left eye: Left conjunctiva is injected. No chemosis, exudate or hemorrhage.    Pupils: Pupils are equal, round, and reactive to light.     Left eye: Corneal abrasion and fluorescein uptake present. Seidel exam negative.    Funduscopic exam:       Left eye: No papilledema.     Slit lamp exam:    Right eye: No photophobia.     Left eye: No corneal flare, corneal ulcer, foreign body, hyphema or photophobia.     Comments: Small corneal abrasion present at the central left eye.  No foreign body seen.  Cardiovascular:     Rate and Rhythm: Normal rate and  regular rhythm.  Pulmonary:     Effort: Pulmonary effort is normal.  Musculoskeletal:        General: Normal range of motion.     Cervical back: Normal range of motion.  Skin:    General: Skin is warm.  Neurological:     General: No focal deficit present.     Mental Status: She is alert.     (all labs ordered are listed, but only abnormal results are displayed) Labs Reviewed - No data to display  EKG: None  Radiology: No results found.   Procedures       Visual Acuity  Right Eye Distance: 20/15 Left Eye Distance: 20/30 Bilateral Distance: 20/15   Medications Ordered in the ED  tobramycin (TOBREX) 0.3 % ophthalmic solution 1 drop (has no administration in time range)   tetracaine (PONTOCAINE) 0.5 % ophthalmic solution 2 drop (2 drops Left Eye Given 06/13/24 0924)  fluorescein ophthalmic strip 1 strip (1 strip Left Eye Given 06/13/24 0924)                                    Medical Decision Making Patient here for evaluation of foreign body sensation and burning of her left eye.  She accidentally got hot sauce in her eye last evening.  She rinsed her eye with tap water and applied over-the-counter Visine drops without improvement.  She has pain to her eye associated with blinking.  Denies any swelling, visual changes, pain redness or swelling of her face.  Wears corrective glasses but denies contact use.  Differential would include but not limited to foreign body, corneal abrasion, corneal ulcer, iritis, uveitis  Amount and/or Complexity of Data Reviewed Discussion of management or test interpretation with external provider(s): Small corneal abrasion of the central left eye.  No corneal ulcer or hyphema and negative Sidel sign.  I did not appreciate any foreign bodies under the upper or lower lids.  No edema or erythema of the periorbital area.  Eye was irrigated and tobramycin drops applied.  Patient agreeable to symptomatic treatment and outpatient follow-up with ophthalmology.  Return precautions were given.  Risk Prescription drug management.        Final diagnoses:  Abrasion of left cornea, initial encounter    ED Discharge Orders     None          Herlinda Milling, PA-C 06/13/24 0950    Yolande Lamar BROCKS, MD 06/18/24 [id number]
